# Patient Record
Sex: Female | Born: 1955 | Race: Black or African American | Hispanic: No | Marital: Married | Smoking: Never smoker
Health system: Southern US, Community
[De-identification: ages and names within clinical notes are randomized; demographics above are authoritative.]

## PROBLEM LIST (undated history)

## (undated) DIAGNOSIS — T4145XA Adverse effect of unspecified anesthetic, initial encounter: Secondary | ICD-10-CM

## (undated) DIAGNOSIS — G43909 Migraine, unspecified, not intractable, without status migrainosus: Secondary | ICD-10-CM

## (undated) DIAGNOSIS — Z87411 Personal history of vaginal dysplasia: Secondary | ICD-10-CM

## (undated) DIAGNOSIS — Z86018 Personal history of other benign neoplasm: Secondary | ICD-10-CM

## (undated) DIAGNOSIS — K219 Gastro-esophageal reflux disease without esophagitis: Secondary | ICD-10-CM

## (undated) DIAGNOSIS — K5909 Other constipation: Secondary | ICD-10-CM

## (undated) DIAGNOSIS — Z973 Presence of spectacles and contact lenses: Secondary | ICD-10-CM

## (undated) DIAGNOSIS — T8859XA Other complications of anesthesia, initial encounter: Secondary | ICD-10-CM

## (undated) DIAGNOSIS — I10 Essential (primary) hypertension: Secondary | ICD-10-CM

## (undated) DIAGNOSIS — N891 Moderate vaginal dysplasia: Secondary | ICD-10-CM

## (undated) HISTORY — PX: HYMENECTOMY: SHX987

## (undated) HISTORY — DX: Essential (primary) hypertension: I10

---

## 1994-08-26 HISTORY — PX: UMBILICAL HERNIA REPAIR: SHX196

## 1997-08-26 HISTORY — PX: HYSTEROSCOPY WITH RESECTOSCOPE: SHX5395

## 1997-12-16 ENCOUNTER — Ambulatory Visit (HOSPITAL_COMMUNITY): Admission: RE | Admit: 1997-12-16 | Discharge: 1997-12-16 | Payer: Self-pay | Admitting: Obstetrics and Gynecology

## 1998-08-10 ENCOUNTER — Inpatient Hospital Stay (HOSPITAL_COMMUNITY): Admission: RE | Admit: 1998-08-10 | Discharge: 1998-08-12 | Payer: Self-pay | Admitting: Obstetrics and Gynecology

## 1999-01-11 ENCOUNTER — Other Ambulatory Visit: Admission: RE | Admit: 1999-01-11 | Discharge: 1999-01-11 | Payer: Self-pay | Admitting: Obstetrics and Gynecology

## 1999-04-02 ENCOUNTER — Encounter: Payer: Self-pay | Admitting: Emergency Medicine

## 1999-04-02 ENCOUNTER — Emergency Department (HOSPITAL_COMMUNITY): Admission: EM | Admit: 1999-04-02 | Discharge: 1999-04-02 | Payer: Self-pay | Admitting: Emergency Medicine

## 2002-04-29 ENCOUNTER — Encounter: Admission: RE | Admit: 2002-04-29 | Discharge: 2002-04-29 | Payer: Self-pay | Admitting: Obstetrics and Gynecology

## 2002-04-29 ENCOUNTER — Encounter: Payer: Self-pay | Admitting: Obstetrics and Gynecology

## 2003-08-27 HISTORY — PX: COMBINED HYSTEROSCOPY DIAGNOSTIC / D&C: SUR297

## 2004-05-24 ENCOUNTER — Inpatient Hospital Stay (HOSPITAL_COMMUNITY): Admission: EM | Admit: 2004-05-24 | Discharge: 2004-05-25 | Payer: Self-pay | Admitting: *Deleted

## 2004-05-25 ENCOUNTER — Encounter (INDEPENDENT_AMBULATORY_CARE_PROVIDER_SITE_OTHER): Payer: Self-pay | Admitting: *Deleted

## 2004-05-25 HISTORY — PX: TRANSTHORACIC ECHOCARDIOGRAM: SHX275

## 2004-06-19 ENCOUNTER — Encounter: Admission: RE | Admit: 2004-06-19 | Discharge: 2004-06-19 | Payer: Self-pay | Admitting: Obstetrics and Gynecology

## 2004-07-23 ENCOUNTER — Other Ambulatory Visit: Admission: RE | Admit: 2004-07-23 | Discharge: 2004-07-23 | Payer: Self-pay | Admitting: Obstetrics and Gynecology

## 2005-03-22 IMAGING — US US PELVIS COMPLETE MODIFY
1 series · 14 of 25 positions shown · non-contrast
Comparison: none

CLINICAL DATA: Anemia. History fibroids and myomectomy.

[Series 1: unknown · 0.32mm/px · 14 of 67 slices shown]
[im 1/67]
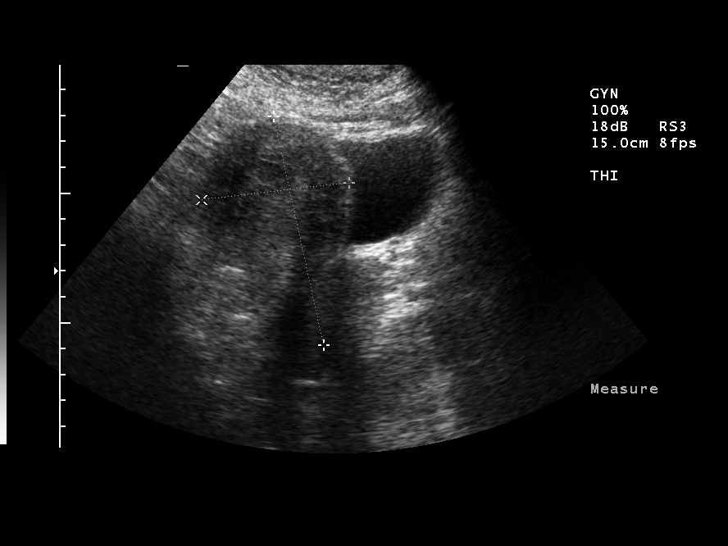
[im 6/67]
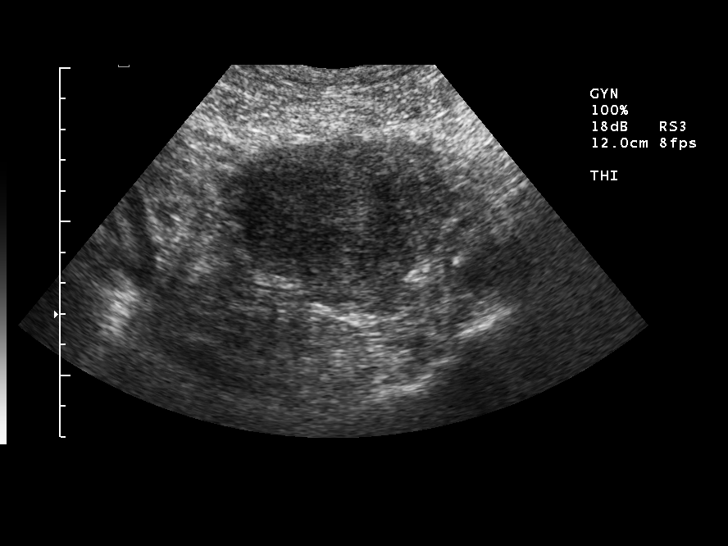
[im 12/67]
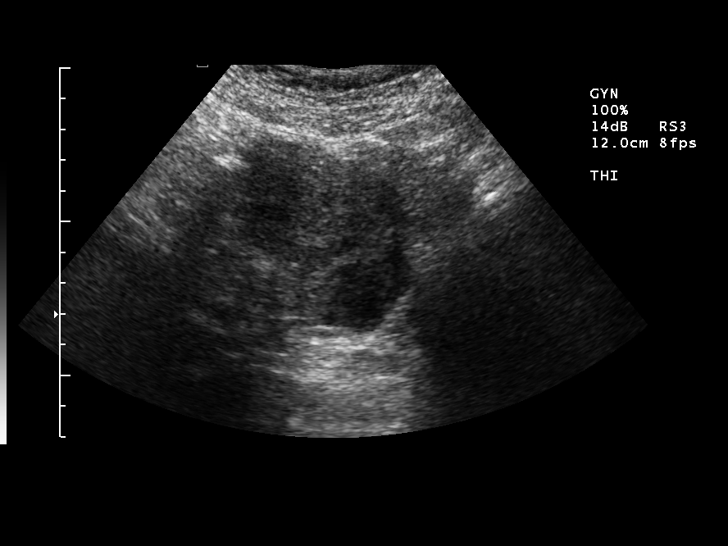
[im 17/67]
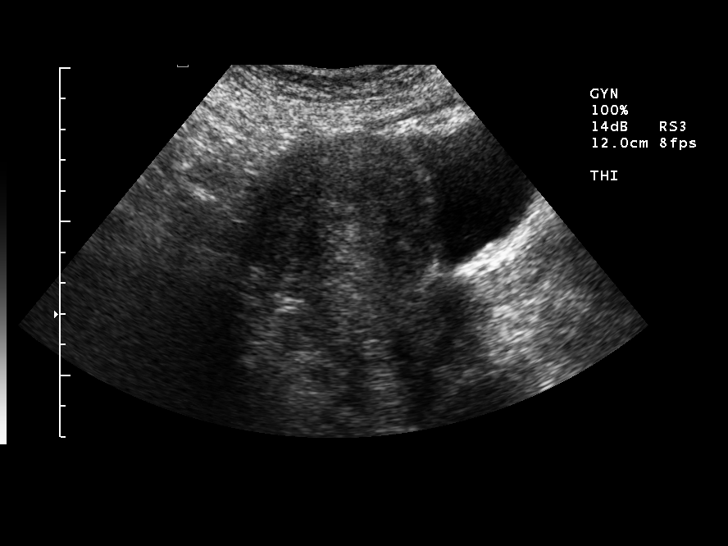
[im 23/67]
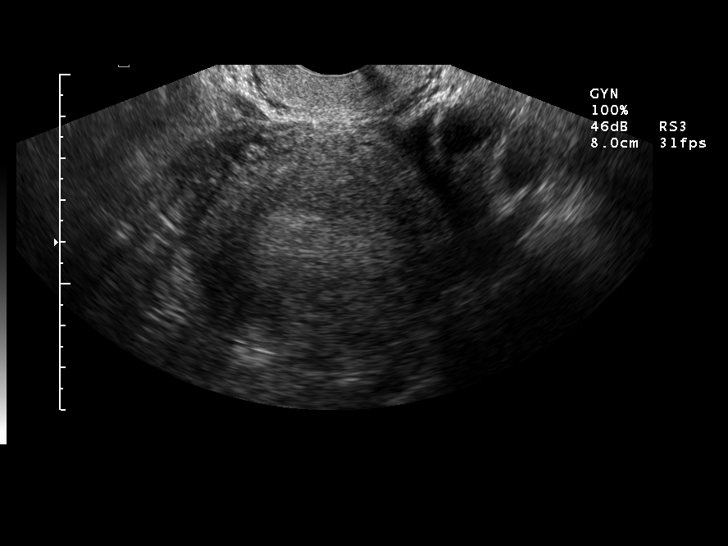
[im 25/67]
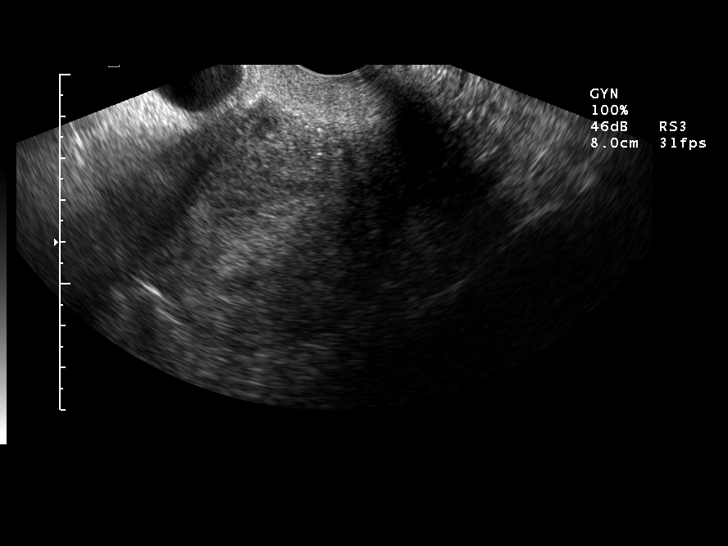
[im 31/67]
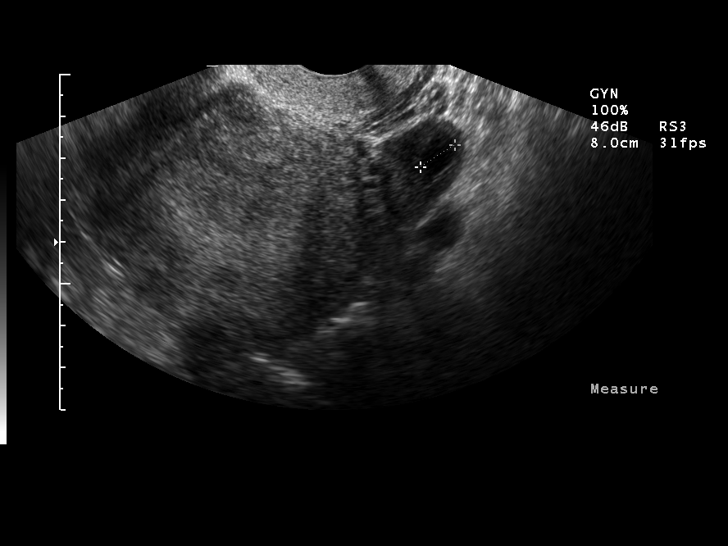
[im 36/67]
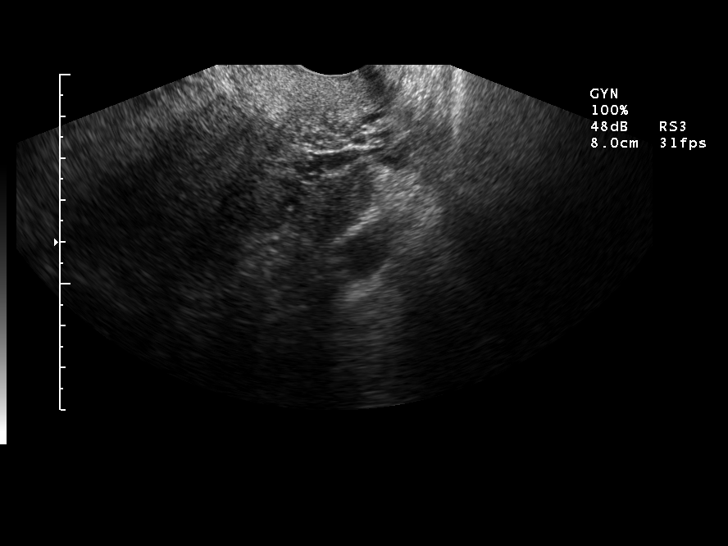
[im 42/67]
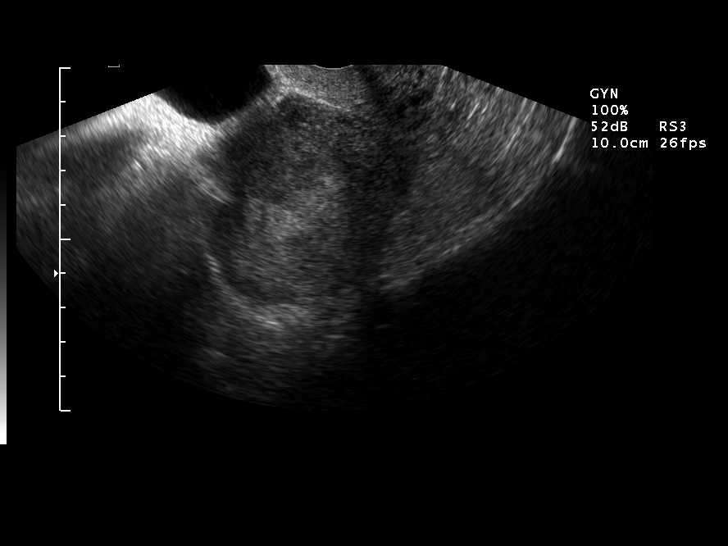
[im 45/67]
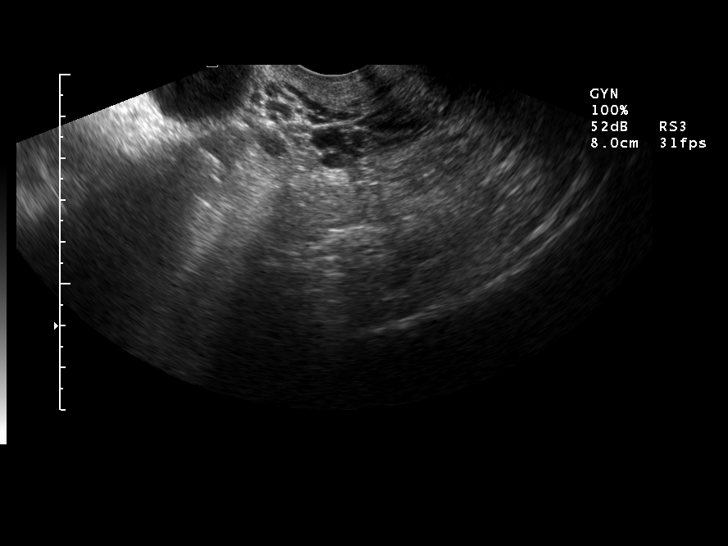
[im 50/67]
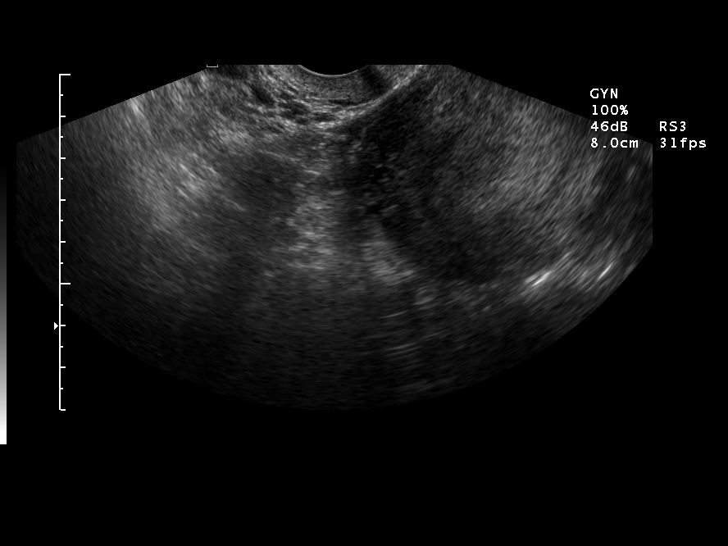
[im 56/67]
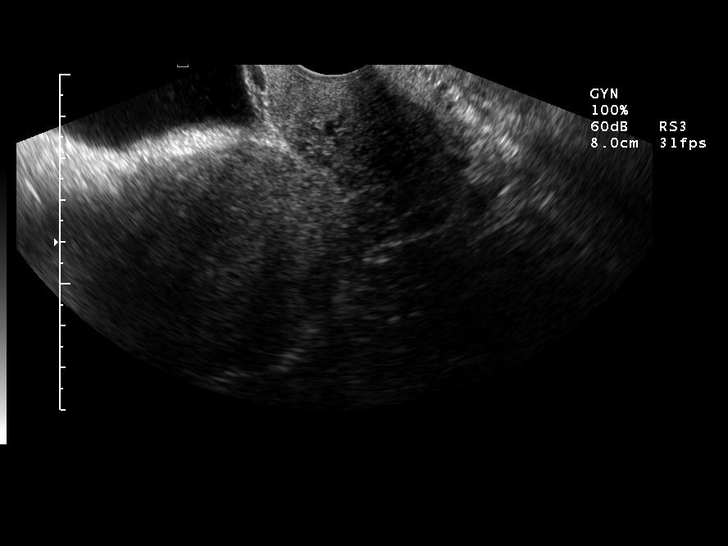
[im 61/67]
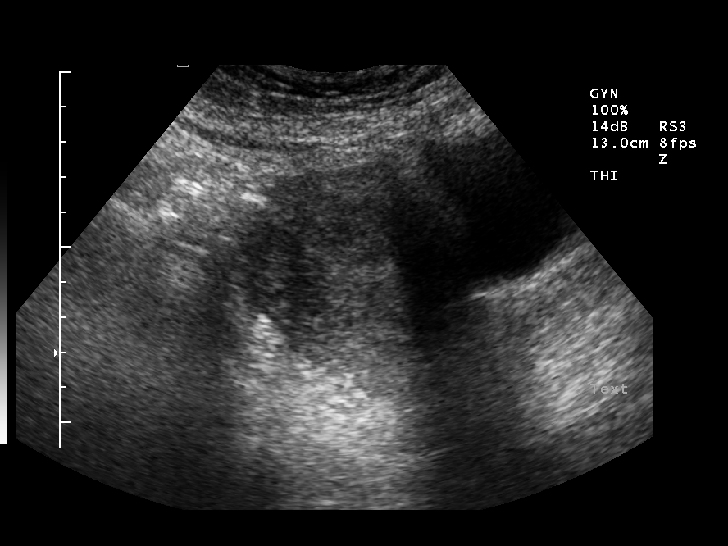
[im 67/67]
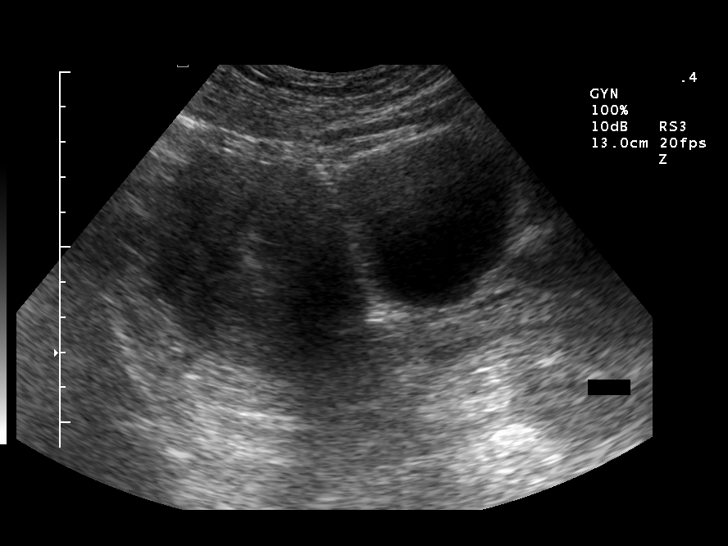

[14 of 25 positions shown; findings below may reference images not displayed]

Ultrasound pelvis

Transabdominal and transvaginal scanning performed.

The uterus measures 9.0 x 5.5 x 6.2 cm. The myometrium is heterogeneous and there are 2 ill-defined
fibroids identified measuring 12 x 9 mm and 23 x 26 mm. The endometrium is heterogeneous and is
considerably thickened at 2. cm. This may be due to endometrial neoplasm or blood in the uterine
cavity.

The right ovary measures 21 x 14 x 18 mm. It appears normal. The left ovary measures 36 x 24 x 21
mm and contains a 15 x 10 mm cyst. There is no free fluid.
IMPRESSION: Heterogeneous uterus containing several fibroids.

The endometrium is thickened at 2 cm. This may be due to an endometrial neoplasm or blood in the
uterine cavity. Consider hysterosonogram to evaluate for mass lesion.

## 2005-03-22 IMAGING — CR DG CHEST 2V
2 series · 2 of 2 positions shown · non-contrast
Comparison: none

CLINICAL DATA: Anemia. Preadmission.
CHEST 2 VIEWS
 PA and lateral views without comparison views reveal the heart size to be normal. Diffuse
calcified granulomas are present. No active lung findings.
IMPRESSION
No active disease.

[view not recorded (1 of 2)]
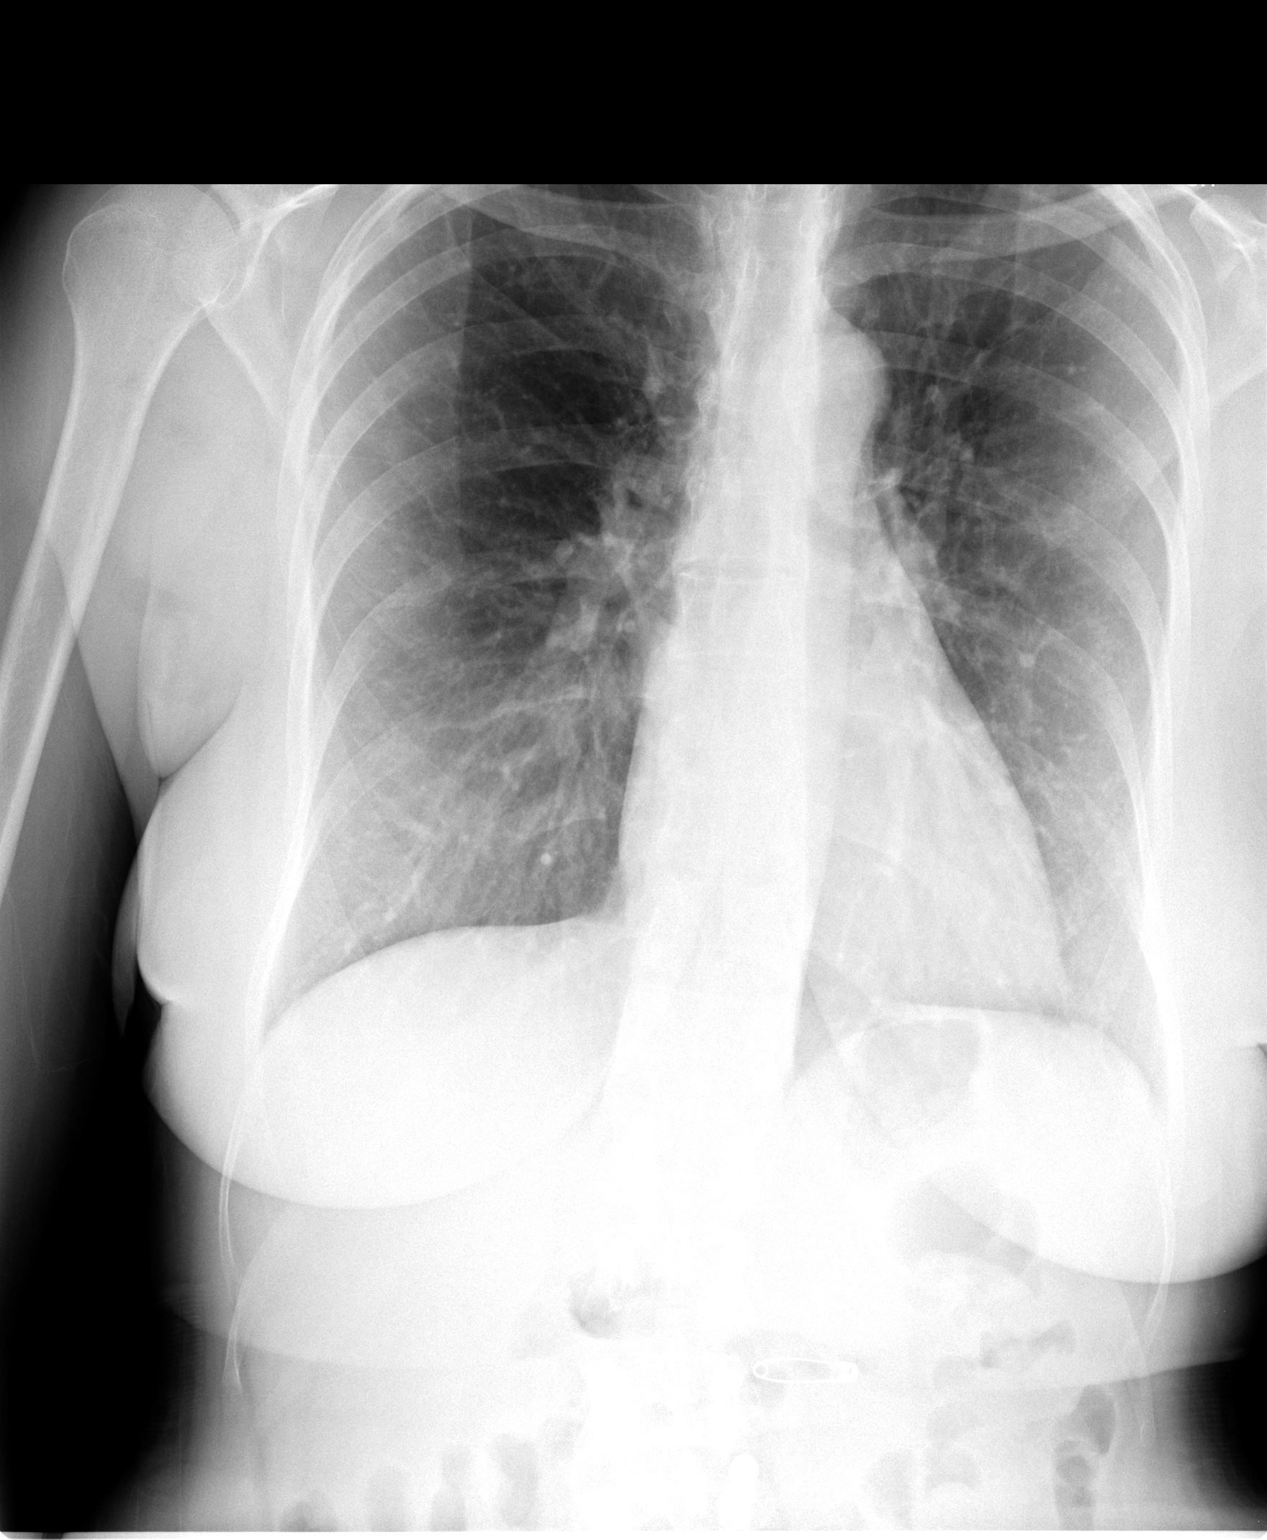

[view not recorded (2 of 2)]
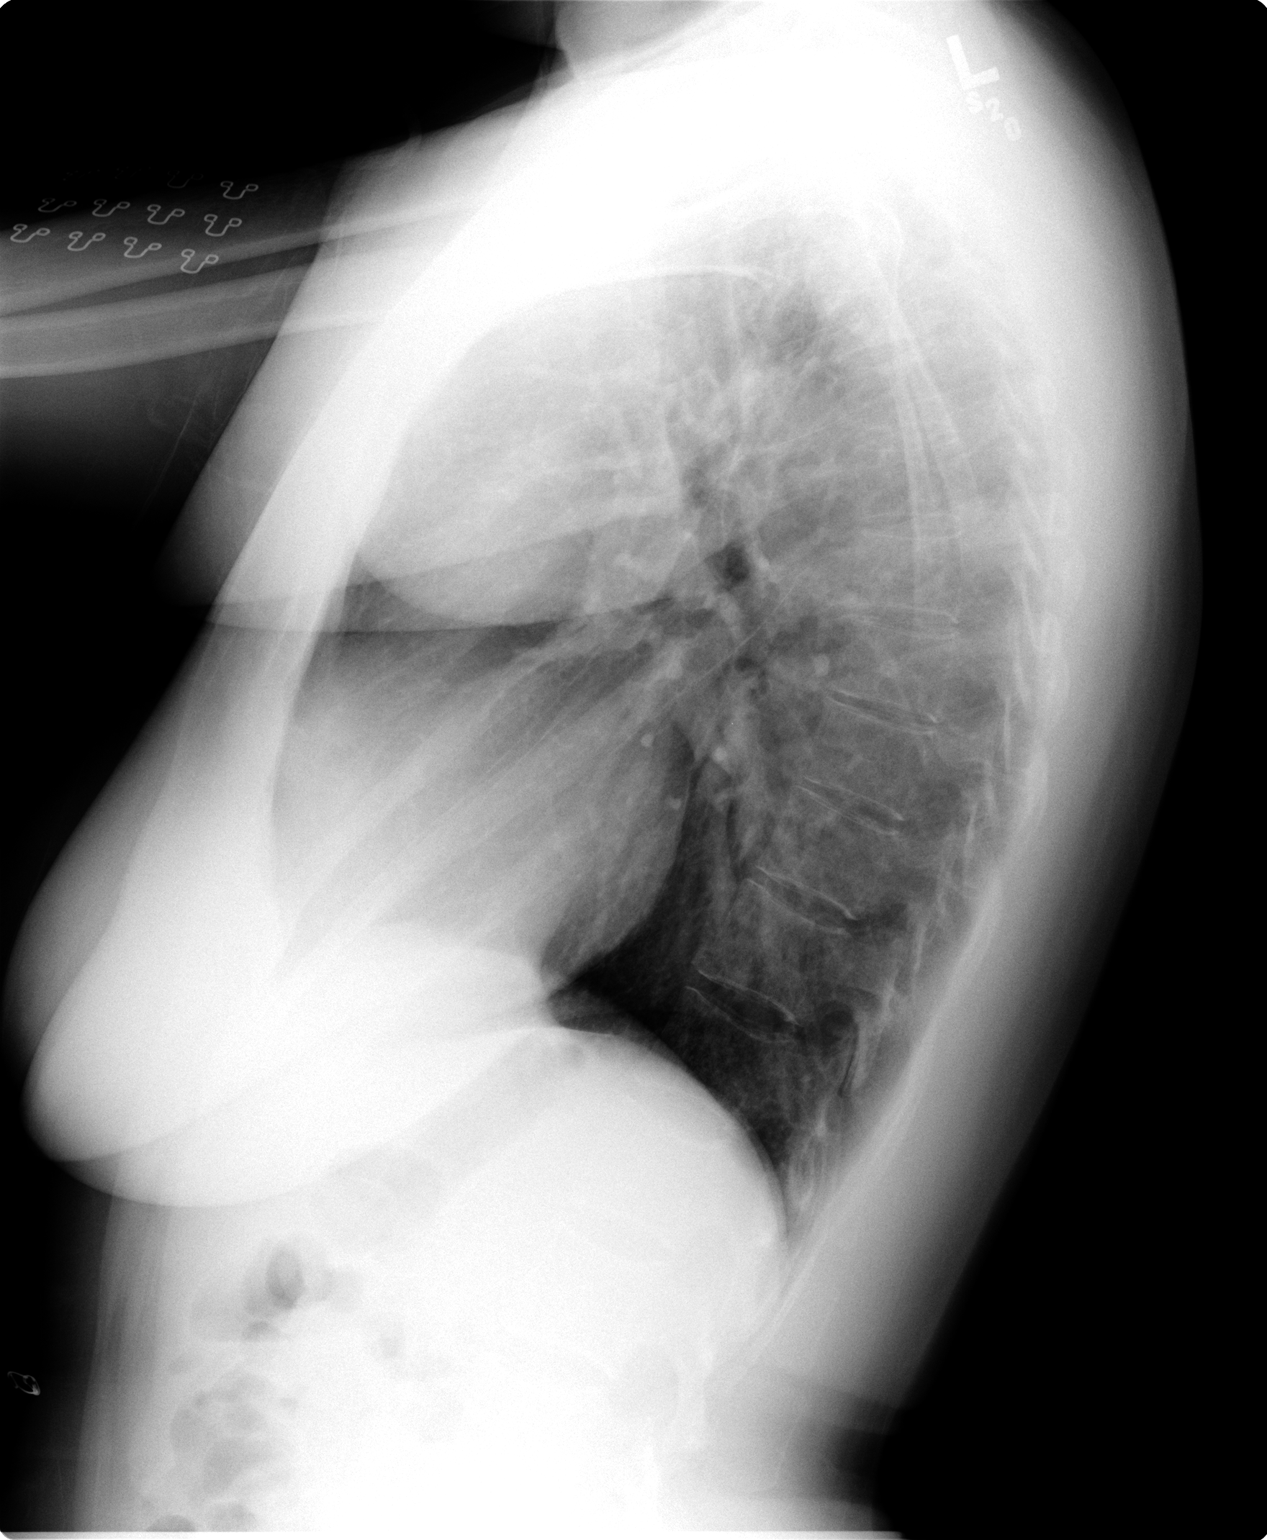

[2 of 2 positions shown; findings below may reference images not displayed]

## 2005-04-01 ENCOUNTER — Other Ambulatory Visit: Admission: RE | Admit: 2005-04-01 | Discharge: 2005-04-01 | Payer: Self-pay | Admitting: Obstetrics and Gynecology

## 2005-06-25 ENCOUNTER — Encounter (INDEPENDENT_AMBULATORY_CARE_PROVIDER_SITE_OTHER): Payer: Self-pay | Admitting: *Deleted

## 2005-06-25 ENCOUNTER — Observation Stay (HOSPITAL_COMMUNITY): Admission: RE | Admit: 2005-06-25 | Discharge: 2005-06-26 | Payer: Self-pay | Admitting: Obstetrics and Gynecology

## 2005-06-25 HISTORY — PX: LAPAROSCOPIC ASSISTED VAGINAL HYSTERECTOMY: SHX5398

## 2007-06-11 ENCOUNTER — Encounter: Admission: RE | Admit: 2007-06-11 | Discharge: 2007-06-11 | Payer: Self-pay | Admitting: Obstetrics and Gynecology

## 2008-11-29 ENCOUNTER — Encounter: Admission: RE | Admit: 2008-11-29 | Discharge: 2008-11-29 | Payer: Self-pay | Admitting: Obstetrics and Gynecology

## 2011-01-11 NOTE — H&P (Signed)
[name], Castro              ACCOUNT NO.:  [id number]   MEDICAL RECORD NO.:  [id number]          PATIENT TYPE:  INP   LOCATION:  0102                         FACILITY:  [facility]   PHYSICIAN:  Deirdre Peer. Polite, M.D. DATE OF BIRTH:  04/12/1956   DATE OF ADMISSION:  05/24/2004  DATE OF DISCHARGE:                                HISTORY & PHYSICAL   CHIEF COMPLAINT:  No energy, feet swelling.   HISTORY OF PRESENT ILLNESS:  [name] is a pleasant 55 year old female  with no significant past medical history except for iron-deficiency anemia  secondary to menorrhagia. The patient presented to her primary care M.D. for  evaluation secondary to lethargy and bilateral feet swelling for 2 to 3  weeks. The patient thought that she was just retaining fluid. The patient  had blood work which revealed critical anemia of 5.4 and per the patient  possibly a UTI. The patient was asked to come back to the office for  inpatient admission for further evaluation and treatment. At the time of my  evaluation, the patient is alert and oriented in no apparent distress. Does  admit to having significant history of menometrorrhagia all of her life; in  fact has been noncompliant with iron x3 months secondary to constipation.  The patient admits to heavy menstrual flow, 5 to 6 days every 30 day cycle.  During that time, the patient requires changing her pads 3 to 4 times a day,  and when she does that, she wears 2 pads each time she changes it. The  patient denies any blood per rectum. Does admit some vague discomfort in  chest at night. Denies any orthopnea. Does admit to swelling in the feet a  couple of weeks. Admission is deemed necessary for further evaluation and  treatment.   PAST MEDICAL HISTORY:  As stated above.   MEDICATIONS ON ADMISSION:  None.   SOCIAL HISTORY:  Negative for tobacco, alcohol, or drugs.   PAST SURGICAL HISTORY:  Significant for fibroid tumors removed from the  uterus  approximately 3 years ago. The patient had a Cesarean section  approximately 20 years ago.   ALLERGIES:  The patient describes allergy to PENICILLIN which causes hives.   FAMILY HISTORY:  Mother with hypertension, father unknown. Five brothers and  two sisters who are relatively healthy.   REVIEW OF SYSTEMS:  As stated above in the HPI.   PHYSICAL EXAMINATION:  GENERAL:  The patient was alert and oriented x3 in no  apparent distress.  VITAL SIGNS:  Temperature 98.2, BP 114/70, pulse 79, respiratory rate 20,  saturating 100%.  HEENT:  Significant for pale sclerae.  CHEST:  Clear to auscultation. No rales, no rhonchi.  CARDIOVASCULAR:  S1 and S2. No S3 appreciated.  ABDOMEN:  Soft, nontender. No organomegaly.  EXTREMITIES:  No edema, 2+ pulse, pale nail beds.  NEUROLOGICAL: Nonfocal.   LABORATORY DATA:  CBC:  White count 5.1, hemoglobin 5, hematocrit 17.8, MCV  52.7, platelets 324, reticulocyte count 2.5. Outpatient iron studies shows a  ferritin level of 2.2, iron of 11, iron binding capacity of 514, iron  saturation of 2.   ASSESSMENT:  1.  Critical anemia of 5.0.  2.  History of iron-deficiency anemia, was compliant with iron x3 years.  3.  History of menometrorrhagia.  4.  Bilateral feet swelling for 3 to 4 weeks which has resolved with      elevation of legs.  5.  Rule out high output failure to critical anemia.   RECOMMENDATIONS:  Recommend patient be admitted to medicine floor bed,  transfused with 3 units of packed red blood cells, iron studies which have  been checked. Also recommend pelvic ultrasound to rule out dominant fibroid  significant menometrorrhagia. The patient will most likely require an  outpatient GYN evaluation for definitive treatment of menometrorrhagia. As  the patient has complained of swelling and legs and decreased energy, will  entertain the idea of obtaining a 2-D echocardiogram to rule out high output  failure secondary to anemia. If the  patient's chest x-ray shows  cardiomegaly, will proceed with ordering 2-D echocardiogram. Will make  further recommendations after review of the above studies.     Madeline Castro   RDP/MEDQ  D:  05/24/2004  T:  05/24/2004  Job:  [id number]   cc:   Madeline Castro, M.D.  [address]  [place], [place] [address]  Fax: [phone]

## 2011-01-11 NOTE — Discharge Summary (Signed)
NAMECARLYANN, Madeline Castro              ACCOUNT NO.:  [id number]   MEDICAL RECORD NO.:  [id number]          PATIENT TYPE:  INP   LOCATION:  0349                         FACILITY:  [facility]   PHYSICIAN:  Melissa L. Ladona Ridgel, MD  DATE OF BIRTH:  1956-06-26   DATE OF ADMISSION:  05/24/2004  DATE OF DISCHARGE:  05/25/2004                                 DISCHARGE SUMMARY   DISCHARGE DIAGNOSES:  1.  Critical anemia secondary to menorrhagia.  The patient had responded to      transfusion of packed red blood cells totalling 4 units.  She will      follow up with her outpatient physician and restart her iron therapy at      home.  2.  Abnormal thickening of the endometrium on pelvic ultrasound. This will      be followed up by Dr. Henderson Cloud in the outpatient setting, using      hysterosonography.  3.  Lower extremity edema.  This episode resolved with elevation of the      legs. A 2D echocardiogram was completed which preliminarily shows an      ejection fraction of 50%, formal report will follow, however does appear      to be an adequate study.   HISTORY OF PRESENT ILLNESS:  The patient is a 55 year old African-American  female with past medical history significant for menometrorrhagia.  The  patient's last menstrual period just ended May 11, 2004.  She  presented to the emergency room with complaints of lethargy and bilateral  swelling of feet x2-3 weeks.  The patient was found to have critical anemia  of 5.4 and she therefore was admitted to telemetry for transfusion of packed  red blood cells.  The patient received a total of 3 units of packed red  cells with an elevation of her blood count to a hemoglobin of 8.8.  We  obtained a pelvic ultrasound which did show some abnormal thickening of the  endometrium suggestive of either retained blood versus a mass.  I discussed  this case with Dr. Rana Snare who is covering for Dr. Henderson Cloud today.  He states  that this patient can be followed as an  outpatient.  He requests the patient  be sent for follow up hysterosonogram next week.  The plan is to provide  this patient with one more unit of packed cells and discharge her to home on  iron.   On the day of discharge her vital signs are stable, temperature is 98.7,  blood pressure 113/61, pulse is 72, respiratory rate is 18.  GENERAL:  She is in no acute distress.  HEENT:  Pupils are equal, round and reactive to light. Extraocular muscles  are intact.  Mucous membranes are moist. She is anicteric.  NECK:  Supple, no JVD, no lymph nodes.  CHEST:  Clear to auscultation, no rhonchi, rales or wheezes.  CARDIOVASCULAR:  Regular rate and rhythm, positive S1, S2, no S3, S4, no  murmurs, rubs, or gallops.  ABDOMEN:  Soft, nontender, nondistended with positive bowel sounds.  EXTREMITIES:  Show no clubbing, cyanosis or edema.  NEUROLOGIC:  The patient is nonfocal as stated.   LABORATORY DATA:  On the day of discharge white count is 7.4 thousand,  hemoglobin of 8.8, a hematocrit of 27.9 (this is after 3 units of packed  cells), platelets are 332,000.  TSH is 2.119.   Preliminary review of the 2D echo with [facility] reveals and ejection  fraction of 50% with no other abnormalities.  A formal report will be  provided at a later time.   At that time the patient is deemed stable for follow up to her primary care  physician and her gynecologist.  She will be requested to resume her iron  therapy and make an appointment to see next week for follow up ultrasound.   CONDITION ON DISCHARGE:  Stable.     Meli   MLT/MEDQ  D:  05/25/2004  T:  05/26/2004  Job:  [id number]   cc:   Guy Sandifer. Arleta Creek, M.D.  [address]  [place]  [place] [address]  Fax: [phone]   Holley Bouche, M.D.  [address]  [place], [place] [address]  Fax: [phone]

## 2011-01-11 NOTE — H&P (Signed)
Madeline Castro, Madeline Castro              ACCOUNT NO.:  [id number]   MEDICAL RECORD NO.:  [id number]          PATIENT TYPE:  AMB   LOCATION:  SDC                           FACILITY:  [facility]   PHYSICIAN:  Guy Sandifer. Henderson Cloud, M.D. DATE OF BIRTH:  05-13-1956   DATE OF ADMISSION:  DATE OF DISCHARGE:                                HISTORY & PHYSICAL   DATE OF ADMISSION:  June 25, 2005   CHIEF COMPLAINT:  Heavy menses.   HISTORY OF PRESENT ILLNESS:  The patient is a 55 year old divorced black  female G5 P3 who is currently not sexually active with increasingly-heavy  menses taking her out of work. Ultrasound on June 06, 2004 revealed the  uterus measuring 9.4 x 5.1 x 6.4 cm. At that time, endocavitary polypoid  masses were noted. The patient was subsequently taken to the operating room  for hysteroscopy D&C with benign pathology. She has continued to have  extremely heavy bleeding. 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 Pap smear in August 2006 is atypical with negative high-risk HPV.  Colposcopy is done and biopsies are benign. After discussion of the options,  the patient is being admitted for a laparoscopic-assisted vaginal  hysterectomy and removal of a tube and ovary if abnormal.   PAST MEDICAL HISTORY:  Negative.   PAST SURGICAL HISTORY:  1.  Hymenectomy for an imperforate hymen at age 38.  2.  Repair of umbilical hernia in 1996.  3.  Uterine myomectomy, 1999.  4.  Hysteroscopy D&C, 2005.   MEDICATIONS:  Vitamins.   ALLERGIES:  PENICILLIN leading to rash.   OBSTETRICAL HISTORY:  Vaginal delivery x2, cesarean section x1.   SOCIAL HISTORY:  The patient denies tobacco, alcohol, or drug abuse.   FAMILY HISTORY:  Positive for diabetes in maternal grandmother, malignancy  of unknown type in maternal grandmother.   REVIEW OF SYSTEMS:  NEURO:  Denies headache. CARDIO:  Denies chest pain.  PULMONARY:  Denies shortness of breath.   PHYSICAL EXAMINATION:  VITAL SIGNS:   Height 5 feet 5 inches, blood pressure  124/84.  HEENT:  Without thyromegaly.  LUNGS:  Clear to auscultation.  HEART:  Regular rate and rhythm.  BACK:  Without CVA tenderness.  BREASTS:  Not examined.  ABDOMEN:  Soft, nontender, without masses.  PELVIC:  Vulva and vagina without lesions. Cervix with leukoplakia as above.  Uterus is 8 weeks size with a probably fundal fibroid. Adnexa nontender  without palpable masses.  EXTREMITIES:  Grossly within normal limits.  NEUROLOGIC:  Grossly within normal limits.   ASSESSMENT:  Menometrorrhagia.   PLAN:  Laparoscopic-assisted vaginal hysterectomy and removal of a tube and  ovary if abnormal.      Guy Sandifer. Henderson Cloud, M.D.  Electronically Signed     JET/MEDQ  D:  06/21/2005  T:  06/21/2005  Job:  [id number]

## 2011-01-11 NOTE — Discharge Summary (Signed)
NAMECHARLY, Madeline Castro              ACCOUNT NO.:  [id number]   MEDICAL RECORD NO.:  [id number]          PATIENT TYPE:  OBV   LOCATION:  9307                          FACILITY:  [facility]   PHYSICIAN:  Guy Sandifer. Henderson Cloud, M.D. DATE OF BIRTH:  [birth year]   DATE OF ADMISSION:  06/25/2005  DATE OF DISCHARGE:                                 DISCHARGE SUMMARY   ADMITTING DIAGNOSIS:  Menometrorrhagia.   DISCHARGE DIAGNOSIS:  Menometrorrhagia.   PROCEDURE:  On June 25, 2005, is laparoscopically-assisted vaginal  hysterectomy.   REASON FOR ADMISSION:  This patient is a 55 year old divorced black female  G5 P3 with increasingly heavy bleeding. Details dictated in the history and  physical. She is admitted for surgical management.   HOSPITAL COURSE:  The patient is admitted to the hospital and undergoes the  above procedure. On the evening of surgery she has good pain relief. She has  had some nausea relieved with medications and is not yet passing flatus.  Urine output is clear, vital signs are stable, and she is afebrile. On the  day of discharge she is passing flatus and has tolerated a regular diet.  Foley catheter has been removed and she has voided. Pain relief is good.  Vital signs are stable and she remains afebrile. White count is 14.4,  hemoglobin 10.3, and pathology is pending. Her abdomen is soft and flat with  positive bowel sounds.   CONDITION ON DISCHARGE:  Good.   DIET:  Regular as tolerated.   ACTIVITY:  No lifting, no operation of automobiles, no vaginal entry. She is  to call the office for problems including but not limited to heavy vaginal  bleeding, temperature of 101 degrees, persistent nausea or vomiting, or  increasing pain.   MEDICATIONS:  1.  Percocet 5/325 mg #30 one to two p.o. q.6h. p.r.n.  2.  Ibuprofen 600 mg q.6h. p.r.n.  3.  Multivitamin daily.   FOLLOW-UP:  Is in the office in two weeks.      Guy Sandifer Henderson Cloud, M.D.  Electronically Signed     JET/MEDQ  D:  06/26/2005  T:  06/26/2005  Job:  [id number]

## 2011-01-11 NOTE — Op Note (Signed)
NAMESAMAYAH, NOVINGER              ACCOUNT NO.:  [id number]   MEDICAL RECORD NO.:  [id number]          PATIENT TYPE:  OBV   LOCATION:  9307                          FACILITY:  [facility]   PHYSICIAN:  Guy Sandifer. Henderson Cloud, M.D. DATE OF BIRTH:  November 24, 1955   DATE OF PROCEDURE:  06/25/2005  DATE OF DISCHARGE:                                 OPERATIVE REPORT   PREOPERATIVE DIAGNOSIS:  Menometrorrhagia.   POSTOPERATIVE DIAGNOSIS:  Menometrorrhagia.   PROCEDURE:  Laparoscopically-assisted vaginal hysterectomy.   SURGEON:  Guy Sandifer. Henderson Cloud, M.D.   ASSISTANT:  Zelphia Cairo, M.D.   ANESTHESIA:  General endotracheal intubation.   ESTIMATED BLOOD LOSS:  400 mL.   SPECIMENS TO PATHOLOGY:  Uterus.   INDICATIONS:  The patient is a 55 year old divorced black female, gravida 5,  para 3 with increasingly heavy menses. Details dictated in history and  physical. Laparoscopically-assisted vaginal hysterectomy and removal of tube  or ovaries if distinctly abnormal has been discussed preoperatively.  Potential risks and complications were reviewed preoperatively including but  not limited to infection, bowel, bladder, ureteral damage, bleeding  requiring transfusion of blood products with possible transfusion reaction,  HIV and hepatitis acquisition, DVT, PE, pneumonia, fistula formation,  dyspareunia, laparotomy. All questions were answered, consent signed on the  chart.   FINDINGS:  Uterus has an approximately 3 cm fundal subserosal fibroid. Tubes  and ovaries normal bilaterally. Anterior posterior cul-de-sacs were normal.   PROCEDURE:  The patient is taken to operating room where she is identified,  placed in dorsosupine position and general anesthesia was obtained via  endotracheal intubation. She is then placed in dorsal lithotomy position  where she is prepped abdominally and vaginally, bladder straight  catheterized, Hulka tenaculum was placed uterus was manipulator, and she is  draped in  sterile fashion. Two Allis clamps used to create an incision  beneath the umbilicus and dissection is carried out in layers. The anterior  fascia is entered and each angle of the incision was anchored with a 0  Vicryl suture. Further dissection enters the peritoneal cavity without  difficulty. Disposable open laparoscopic trocar sleeve was placed and tied  down with anchoring sutures. Inspection reveals the above findings. A small  suprapubic incision was made and a 5 mm disposable XL bladeless trocar  sleeve was placed under direct visualization without difficulty. The above  findings were noted. The gyrus bipolar cautery cutting instrument was then  used to take down the proximal ligaments bilaterally to the level  vesicouterine peritoneum. The vesicouterine peritoneum was incised in  midline and dissected cephalolaterally. The suprapubic trocar sleeve was  removed and attention was turned to vagina. Cervix is largely flush with the  vaginal fornix. Dissection releases the cervix and the posterior cul-de-sac  is entered sharply. Cervix was circumscribed with a scalpel and the anterior  cul-de-sac was also entered without difficulty. Then using the gyrus bipolar  cautery instrument. The uterosacral ligaments  were taken down followed by  the bladder pillars and the cardinal ligaments and the uterine vessels  bilaterally. Fundus was delivered posteriorly. Remaining pedicles were taken  down. The specimens delivered.  The sutures 0 Monocryl placed at 3 and 9  o'clock position to ensure complete hemostasis. All suture will be 0  Monocryl unless otherwise designated. Uterosacral ligaments were plicated  the vaginal cuff bilaterally. Vaginal cuff is then closed with figure-of-  eights. Foley catheter was placed in the bladder and clear urine was noted.  Returning to the laparoscope, careful inspection reveals only minor bleeding  at the anterior peritoneal edge which was controlled with bipolar  cautery.  Reinspection under reduced pneumoperitoneum reveals excellent hemostasis all  around. Excess fluid was removed. All trocar sleeves were removed and the  pneumoperitoneum was therefore taken down. The angle sutures of the  peritoneum at the umbilical incision are tied midline closing the umbilical  incision. The skin of the umbilical incision was closed with interrupted 2-0  Vicryl suture. Both incisions injected with 1/2% plain Marcaine and  Dermabond was applied to both incisions. All counts correct. The patient is  awakened, taken to recovery room in stable condition.      Guy Sandifer Henderson Cloud, M.D.  Electronically Signed     JET/MEDQ  D:  06/25/2005  T:  06/25/2005  Job:  [id number]

## 2013-04-23 ENCOUNTER — Other Ambulatory Visit: Payer: Self-pay

## 2013-04-23 DIAGNOSIS — Z1231 Encounter for screening mammogram for malignant neoplasm of breast: Secondary | ICD-10-CM

## 2013-04-28 ENCOUNTER — Ambulatory Visit: Payer: Self-pay

## 2013-04-29 ENCOUNTER — Ambulatory Visit
Admission: RE | Admit: 2013-04-29 | Discharge: 2013-04-29 | Disposition: A | Discharge status: Home / Self Care | Payer: Self-pay | Source: Ambulatory Visit

## 2013-04-29 DIAGNOSIS — Z1231 Encounter for screening mammogram for malignant neoplasm of breast: Secondary | ICD-10-CM

## 2013-05-03 ENCOUNTER — Other Ambulatory Visit: Payer: Self-pay | Admitting: Family Medicine

## 2013-05-03 DIAGNOSIS — R928 Other abnormal and inconclusive findings on diagnostic imaging of breast: Secondary | ICD-10-CM

## 2013-05-06 ENCOUNTER — Ambulatory Visit
Admission: RE | Admit: 2013-05-06 | Discharge: 2013-05-06 | Disposition: A | Discharge status: Home / Self Care | Payer: Commercial Indemnity | Source: Ambulatory Visit | Attending: Family Medicine | Admitting: Family Medicine

## 2013-05-06 DIAGNOSIS — R928 Other abnormal and inconclusive findings on diagnostic imaging of breast: Secondary | ICD-10-CM

## 2016-02-04 ENCOUNTER — Encounter (HOSPITAL_COMMUNITY): Payer: Self-pay | Admitting: Emergency Medicine

## 2016-02-04 ENCOUNTER — Emergency Department (HOSPITAL_COMMUNITY)
Admission: EM | Admit: 2016-02-04 | Discharge: 2016-02-04 | Disposition: A | Discharge status: Home / Self Care | Payer: BLUE CROSS/BLUE SHIELD | Attending: Emergency Medicine | Admitting: Emergency Medicine

## 2016-02-04 DIAGNOSIS — W57XXXA Bitten or stung by nonvenomous insect and other nonvenomous arthropods, initial encounter: Secondary | ICD-10-CM | POA: Diagnosis not present

## 2016-02-04 DIAGNOSIS — Y999 Unspecified external cause status: Secondary | ICD-10-CM | POA: Diagnosis not present

## 2016-02-04 DIAGNOSIS — Y939 Activity, unspecified: Secondary | ICD-10-CM | POA: Diagnosis not present

## 2016-02-04 DIAGNOSIS — Y929 Unspecified place or not applicable: Secondary | ICD-10-CM | POA: Insufficient documentation

## 2016-02-04 DIAGNOSIS — S40861A Insect bite (nonvenomous) of right upper arm, initial encounter: Secondary | ICD-10-CM | POA: Diagnosis present

## 2016-02-04 DIAGNOSIS — L03113 Cellulitis of right upper limb: Secondary | ICD-10-CM | POA: Insufficient documentation

## 2016-02-04 DIAGNOSIS — Z79899 Other long term (current) drug therapy: Secondary | ICD-10-CM | POA: Insufficient documentation

## 2016-02-04 MED ORDER — HYDROXYZINE HCL 25 MG PO TABS: 25.0000 mg | ORAL_TABLET | ORAL | Status: DC | PRN

## 2016-02-04 MED ORDER — CEPHALEXIN 500 MG PO CAPS: 500.0000 mg | ORAL_CAPSULE | Freq: Four times a day (QID) | ORAL | Status: AC

## 2016-02-04 MED ORDER — SULFAMETHOXAZOLE-TRIMETHOPRIM 800-160 MG PO TABS: 1.0000 | ORAL_TABLET | Freq: Two times a day (BID) | ORAL | Status: DC

## 2016-02-04 MED ORDER — CEPHALEXIN 500 MG PO CAPS
500.0000 mg | ORAL_CAPSULE | Freq: Once | ORAL | Status: AC
  Administered 2016-02-04: 500 mg via ORAL
  Filled 2016-02-04: qty 1

## 2016-02-04 MED ORDER — HYDROXYZINE HCL 25 MG PO TABS
50.0000 mg | ORAL_TABLET | Freq: Once | ORAL | Status: AC
Start: 2016-02-04 — End: 2016-02-04
  Administered 2016-02-04: 50 mg via ORAL
  Filled 2016-02-04: qty 2

## 2016-02-04 MED ORDER — SULFAMETHOXAZOLE-TRIMETHOPRIM 800-160 MG PO TABS
1.0000 | ORAL_TABLET | Freq: Once | ORAL | Status: AC
  Administered 2016-02-04: 1 via ORAL
  Filled 2016-02-04: qty 1

## 2016-02-04 NOTE — ED Notes (Signed)
Pt c/o left arm edema, erythema, itching, and multiple small bulla to right posterior elbow. Skin is red, tissue is swollen. Joint mobile.

## 2016-02-04 NOTE — Discharge Instructions (Signed)
Do not apply rubbing alcohol or hydrogen peroxide to the affected area.   Try to elevate the extremity above the level of the heart is much as possible.   If the redness starts spreading, you develop streaking up the arm, fever, chills, nausea, vomiting or any new or concerning symptoms please return immediately to the emergency department for further evaluation   Cellulitis Cellulitis is an infection of the skin and the tissue under the skin. The infected area is usually red and tender. This happens most often in the arms and lower legs. HOME CARE   Take your antibiotic medicine as told. Finish the medicine even if you start to feel better.  Keep the infected arm or leg raised (elevated).  Put a warm cloth on the area up to 4 times per day.  Only take medicines as told by your doctor.  Keep all doctor visits as told. GET HELP IF:  You see red streaks on the skin coming from the infected area.  Your red area gets bigger or turns a dark color.  Your bone or joint under the infected area is painful after the skin heals.  Your infection comes back in the same area or different area.  You have a puffy (swollen) bump in the infected area.  You have new symptoms.  You have a fever. GET HELP RIGHT AWAY IF:   You feel very sleepy.  You throw up (vomit) or have watery poop (diarrhea).  You feel sick and have muscle aches and pains.   This information is not intended to replace advice given to you by your health care provider. Make sure you discuss any questions you have with your health care provider.   Document Released: 01/29/2008 Document Revised: 05/03/2015 Document Reviewed: 10/28/2011 Elsevier Interactive Patient Education [employer]2016 [employer]

## 2016-02-04 NOTE — ED Provider Notes (Signed)
CSN: [id number]     Arrival date & time 02/04/16  1221 History   First MD Initiated Contact with Patient 02/04/16 1323     Chief Complaint  Patient presents with  . Joint Swelling     (Consider location/radiation/quality/duration/timing/severity/associated sxs/prior Treatment) HPI   Blood pressure 139/81, pulse 77, temperature 98.5 F (36.9 C), temperature source Oral, resp. rate 19, SpO2 97 %.  Madeline Castro is a 60 y.o. female complaining of right (note that this contradicts triage note, which states left) arm erythema, pain and blistering worsening over the course of 24 hours. Patient states that she woke up yesterday and had a bug bite, she was the only when the household affected, she states that she scratched the area when she woke up today there was a significant amount of redness. Patient has been applying alcohol frequently, taking Benadryl for itching and applying topical hydrocortisone ointment home with no relief, she's not diabetic, she doesn't take any chronic steroids or immunosuppressants. She denies fever, chills, nausea, vomiting. States pain is moderate, 5 out of 10, no pain medication taken prior to arrival, pain is exacerbated by movement and palpation. She states her last tetanus shot was within the last 5 years. Patient is right-hand-dominant.  History reviewed. No pertinent past medical history. History reviewed. No pertinent past surgical history. History reviewed. No pertinent family history. Social History  Substance Use Topics  . Smoking status: Never Smoker   . Smokeless tobacco: None  . Alcohol Use: None   OB History    No data available     Review of Systems  10 systems reviewed and found to be negative, except as noted in the HPI.   Allergies  Penicillins  Home Medications   Prior to Admission medications   Not on File   BP 139/81 mmHg  Pulse 77  Temp(Src) 98.5 F (36.9 C) (Oral)  Resp 19  SpO2 97% Physical Exam  Constitutional: She is  oriented to person, place, and time. She appears well-developed and well-nourished. No distress.  HENT:  Head: Normocephalic and atraumatic.  Mouth/Throat: Oropharynx is clear and moist.  Eyes: Conjunctivae and EOM are normal. Pupils are equal, round, and reactive to light.  Neck: Normal range of motion.  Cardiovascular: Normal rate, regular rhythm and intact distal pulses.   Pulmonary/Chest: Effort normal and breath sounds normal.  Abdominal: Soft. There is no tenderness.  Musculoskeletal: Normal range of motion. She exhibits edema and tenderness.  Neurological: She is alert and oriented to person, place, and time.  Skin: She is not diaphoretic.  Induration to right arm over elbow, extending from the mid humerus down to the mid radius ulna, does not affect the wrist or hand. Patient has puncture wound just proximal to the elbow and a small area of blistering as diagrammed. Full range of motion to elbow and distally neurovascularly intact.  Psychiatric: She has a normal mood and affect.  Nursing note and vitals reviewed.         ED Course  Procedures (including critical care time) Labs Review Labs Reviewed - No data to display  Imaging Review No results found. I have personally reviewed and evaluated these images and lab results as part of my medical decision-making.   EKG Interpretation None      MDM   Final diagnoses:  Cellulitis of right upper extremity    Filed Vitals:   02/04/16 1238  BP: 139/81  Pulse: 77  Temp: 98.5 F (36.9 C)  TempSrc: Oral  Resp: 19  SpO2: 97%    Medications  sulfamethoxazole-trimethoprim (BACTRIM DS,SEPTRA DS) 800-160 MG per tablet 1 tablet (not administered)  cephALEXin (KEFLEX) capsule 500 mg (not administered)  hydrOXYzine (ATARAX/VISTARIL) tablet 50 mg (not administered)    Madeline Castro is 60 y.o. female presenting with Cellulitis to right arm no immunocompromise or diabetes, patient has no signs of systemic infection, no  indication that this is traveling to joint she has full range of motion, she is distally neurovascularly intact. Patient will be started on Keflex and Bactrim, hydroxyzine for pruritus. We've had an extensive discussion of return precautions and patient verbalized her understanding.  Evaluation does not show pathology that would require ongoing emergent intervention or inpatient treatment. Pt is hemodynamically stable and mentating appropriately. Discussed findings and plan with patient/guardian, who agrees with care plan. All questions answered. Return precautions discussed and outpatient follow up given.   New Prescriptions   CEPHALEXIN (KEFLEX) 500 MG CAPSULE    Take 1 capsule (500 mg total) by mouth 4 (four) times daily.   HYDROXYZINE (ATARAX/VISTARIL) 25 MG TABLET    Take 1 tablet (25 mg total) by mouth every 4 (four) hours as needed for itching.   SULFAMETHOXAZOLE-TRIMETHOPRIM (BACTRIM DS) 800-160 MG TABLET    Take 1 tablet by mouth 2 (two) times daily.         Wynetta Emery, PA-C 02/04/16 1413  Samuel Jester, DO 02/07/16 2257

## 2016-04-22 ENCOUNTER — Other Ambulatory Visit: Payer: Self-pay | Admitting: Obstetrics and Gynecology

## 2016-04-22 DIAGNOSIS — N6320 Unspecified lump in the left breast, unspecified quadrant: Secondary | ICD-10-CM

## 2016-04-22 DIAGNOSIS — N6310 Unspecified lump in the right breast, unspecified quadrant: Secondary | ICD-10-CM

## 2016-04-23 ENCOUNTER — Encounter: Payer: Self-pay | Admitting: Gastroenterology

## 2016-04-25 ENCOUNTER — Other Ambulatory Visit: Payer: Commercial Indemnity

## 2016-07-02 ENCOUNTER — Ambulatory Visit: Payer: Commercial Indemnity | Admitting: Gastroenterology

## 2016-07-15 ENCOUNTER — Ambulatory Visit: Payer: Commercial Indemnity | Admitting: Gastroenterology

## 2016-07-15 ENCOUNTER — Ambulatory Visit: Payer: Commercial Indemnity | Admitting: Internal Medicine

## 2016-07-23 ENCOUNTER — Encounter: Payer: Self-pay | Admitting: Gastroenterology

## 2016-07-23 ENCOUNTER — Ambulatory Visit (INDEPENDENT_AMBULATORY_CARE_PROVIDER_SITE_OTHER): Payer: BLUE CROSS/BLUE SHIELD | Admitting: Gastroenterology

## 2016-07-23 VITALS — BP 110/70 | HR 64 | Ht 64.75 in | Wt 199.2 lb

## 2016-07-23 DIAGNOSIS — Z1211 Encounter for screening for malignant neoplasm of colon: Secondary | ICD-10-CM

## 2016-07-23 DIAGNOSIS — K5900 Constipation, unspecified: Secondary | ICD-10-CM | POA: Diagnosis not present

## 2016-07-23 DIAGNOSIS — R109 Unspecified abdominal pain: Secondary | ICD-10-CM

## 2016-07-23 MED ORDER — NA SULFATE-K SULFATE-MG SULF 17.5-3.13-1.6 GM/177ML PO SOLN: 1.0000 | Freq: Once | ORAL | 0 refills | Status: AC

## 2016-07-23 NOTE — Progress Notes (Signed)
Agree with assessment and plan as outlined.  

## 2016-07-23 NOTE — Patient Instructions (Signed)
You have been scheduled for a colonoscopy. Please follow written instructions given to you at your visit today.  Please pick up your prep supplies at the pharmacy within the next 1-3 days. If you use inhalers (even only as needed), please bring them with you on the day of your procedure.   Begin using Miralax daily

## 2016-07-23 NOTE — Progress Notes (Signed)
07/23/2016 Madeline Castro [id number] [birth year]   HISTORY OF PRESENT ILLNESS:  This is a 60 year old female who is new to our practice.  Was referred here by Dr. Huntley Decomlin to discuss and schedule colonoscopy.  Never had one in the past.  Is nervous to have one because she had a friend who "bled to death" after a colonoscopy a few years ago.  Reports issues with intubation during her hysterectomy and was told that she needed to relay that information every time that she had a surgery or procedure.    She reports chronic constipation for several years. Says that she only uses MiraLAX with relief on occasion prn. Also reports intermittent cramping abdominal pains that occur a few times per month and usually last about 5 minutes before resolving. Not associated with diarrhea, but gets the sensation she needs to move her bowels but then nothing occurs.  Denies seeing blood in her stools.   Past Medical History:  Diagnosis Date  . Headache   . Hypertension    Past Surgical History:  Procedure Laterality Date  . ABDOMINAL HYSTERECTOMY    . HERNIA REPAIR      reports that she has never smoked. She has never used smokeless tobacco. She reports that she does not drink alcohol or use drugs. family history is not on file. Allergies  Allergen Reactions  . Penicillins Hives and Rash    Has patient had a PCN reaction causing immediate rash, facial/tongue/throat swelling, SOB or lightheadedness with hypotension: yes Has patient had a PCN reaction causing severe rash involving mucus membranes or skin necrosis: no Has patient had a PCN reaction that required hospitalization : already at hospital Has patient had a PCN reaction occurring within the last 10 years: no If all of the above answers are "NO", then may proceed with Cephalosporin use.       Outpatient Encounter Prescriptions as of 07/23/2016  Medication Sig  . SUMAtriptan (IMITREX) 100 MG tablet Take 100 mg by mouth every 2 (two) hours as  needed for migraine. May repeat in 2 hours if headache persists or recurs.  . triamterene-hydrochlorothiazide (MAXZIDE-25) 37.5-25 MG tablet Take 1 tablet by mouth daily.  . [DISCONTINUED] diphenhydrAMINE (SOMINEX) 25 MG tablet Take 25 mg by mouth at bedtime as needed for itching, allergies or sleep.  . [DISCONTINUED] hydrOXYzine (ATARAX/VISTARIL) 25 MG tablet Take 1 tablet (25 mg total) by mouth every 4 (four) hours as needed for itching.  . [DISCONTINUED] sulfamethoxazole-trimethoprim (BACTRIM DS) 800-160 MG tablet Take 1 tablet by mouth 2 (two) times daily.   No facility-administered encounter medications on file as of 07/23/2016.      REVIEW OF SYSTEMS  : All other systems reviewed and negative except where noted in the History of Present Illness.   PHYSICAL EXAM: BP 110/70   Pulse 64   Ht 5' 4.75" (1.645 m)   Wt 199 lb 3.2 oz (90.4 kg)   BMI 33.41 kg/m  General: Well developed black female in no acute distress Head: Normocephalic and atraumatic Eyes:  Sclerae anicteric, conjunctiva pink. Ears: Normal auditory acuity Lungs: Clear throughout to auscultation Heart: Regular rate and rhythm Abdomen: Soft, non-distended.  BS present.  Mild lower abdominal TTP. Rectal:  Will be done at the time of colonoscopy. Musculoskeletal: Symmetrical with no gross deformities  Skin: No lesions on visible extremities Extremities: No edema  Neurological: Alert oriented x 4, grossly non-focal Psychological:  Alert and cooperative. Normal mood and affect  ASSESSMENT AND  PLAN: -Screening colonoscopy:  [facility] due to history of issues with intubation. We'll schedule with Dr. Adela LankArmbruster.  The risks, benefits, and alternatives to colonoscopy were discussed with the patient and she consents to proceed.  -Constipation:  I have asked her to begin taking MiraLAX on a daily basis.  -Intermittent crampy abdominal pain:  ? If related to constipation.   CC:  Harold Hedgeomblin,  James, MD

## 2016-07-31 NOTE — Progress Notes (Signed)
GYNECOLOGIC ONCOLOGY NEW PATIENT CONSULTATION  Date of Service: 08/01/16 Referring Provider: Harold HedgeJames Tomblin, MD Consulting Provider: Junie SpencerLeslie H. Chestine Sporelark, MD  HISTORY OF PRESENT ILLNESS: Madeline Castro is a 60 y.o. woman who is seen in consultation at the request of Dr. 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  In 2006, the patient underwent a hysterectomy for menorrhagia. Since that time she has remarried and reports 6 years of postcoital bleeding. She was seen by Dr. 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 She had a vaginal biopsy showing VAIN1. She did not follow up for further care until this fall. 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 It does not appear that HPV testing has been performed. 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  She has no other significant medical comorbities aside from HTN. She denies active cardiopulmonary complaint. She is currently sexually active with one partner. She has three grown children.  PAST MEDICAL HISTORY: Past Medical History:  Diagnosis Date  . Headache   . Hypertension     PAST SURGICAL HISTORY: Past Surgical History:  Procedure Laterality Date  . ABDOMINAL HYSTERECTOMY    . HERNIA REPAIR      OB/GYN HISTORY: OB History  Gravida Para Term Preterm AB Living  4 3     1 3   SAB TAB Ectopic Multiple Live Births  1            # Outcome Date GA Lbr Len/2nd Weight Sex Delivery Anes PTL Lv  4 SAB           3 Para           2 Para           1 Para             Obstetric Comments  LTCS x1  SAB  SVDx2      MEDICATIONS:  Current Outpatient Prescriptions:  .  SUMAtriptan (IMITREX) 100 MG tablet, Take 100 mg by mouth every 2 (two) hours as needed for migraine. May repeat in 2 hours if headache persists or recurs., Disp: , Rfl:  .  triamterene-hydrochlorothiazide (MAXZIDE-25) 37.5-25 MG  tablet, Take 1 tablet by mouth daily., Disp: , Rfl: 1  ALLERGIES: Allergies  Allergen Reactions  . Penicillins Hives and Rash    Has patient had a PCN reaction causing immediate rash, facial/tongue/throat swelling, SOB or lightheadedness with hypotension: yes Has patient had a PCN reaction causing severe rash involving mucus membranes or skin necrosis: no Has patient had a PCN reaction that required hospitalization : already at hospital Has patient had a PCN reaction occurring within the last 10 years: no If all of the above answers are "NO", then may proceed with Cephalosporin use.     FAMILY HISTORY: Family History  Problem Relation Age of Onset  . Colon cancer Neg Hx   . Esophageal cancer Neg Hx   . Pancreatic cancer Neg Hx   . Stomach cancer Neg Hx   . Liver disease Neg Hx     SOCIAL HISTORY: Social History   Social History  . Marital status: Married    Spouse name: N/A  . Number of children: N/A  . Years of education: N/A   Occupational History  . Not on file.   Social History Main Topics  . Smoking status: Never Smoker  . Smokeless tobacco: Never  Used  . Alcohol use Not on file  . Drug use: Unknown  . Sexual activity: Not on file   Other Topics Concern  . Not on file   Social History Narrative  . No narrative on file    REVIEW OF SYSTEMS: Complete 10-system review is negative except as per HPI.  PHYSICAL EXAM: BP (!) 141/81 (BP Location: Left Arm, Patient Position: Sitting)   Pulse 89   Temp 98.1 F (36.7 C) (Oral)   Resp 18   Wt 199 lb 14.4 oz (90.7 kg)   SpO2 99%   BMI 33.52 kg/m  General: Alert, oriented, no acute distress. HEENT: Normocephalic, atraumatic.  Sclera anicteric, posterior oropharynx clear.  Normal dentition. Chest: Clear to auscultation bilaterally. Cardiovascular: Regular rate and rhythm, no murmurs, rubs, or gallops. Abdomen: Obese. Normoactive bowel sounds.  Soft, nondistended, nontender to palpation.  No masses or  hepatosplenomegaly appreciated.  No evidence of hernia.  No palpable fluid wave.  Well healed pfannenstiel and infraumbilical incisions. Extremities: Grossly normal range of motion.  Warm, well perfused.  No edema bilaterally. Skin: No rashes or lesions. Lymphatics: No cervical, supraclavicular, or inguinal adenopathy. GU: External genitalia without lesions.  On speculum exam, raw appearing vaginal apex, somewhat friable, no discrete lesion, surgically absent uterus.  Bimanual exam reveals intact cuff with surgically absent uterus and cervix, no palpable vaginal mass, no adnexal mass. Rectovaginal exam confirms the above findings and reveals no nodularity.  LABORATORY AND RADIOLOGIC DATA: Outside medical records were reviewed to synthesize the above history, along with the history and physical obtained during the visit.  Outside laboratory, pathology, and imaging reports were reviewed, with pertinent results below.    2006 Hysterectomy path: UTERUS AND CERVIX: - CERVIX: CHRONIC CERVICITIS WITH SQUAMOUS METAPLASIA. NO INTRAEPITHELIAL LESION IDENTIFIED. - ENDOMETRIUM: PROLIFERATIVE ENDOMETRIUM. NO HYPERPLASIA OR MALIGNANCY IDENTIFIED. - MYOMETRIUM: ADENOMYOSIS. LEIOMYOMAS.  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  We discussed the diagnosis of dysplasia and vaginal dysplasia at length today. We discussed the grading of dysplasia and treatment options based on pathology report. We discussed that topical estrogen is used for treatment of VAIN1. Vaginal laser is recommended given [facility]. I have recommended she start using her topical vaginal estrogen therapy and continue this after her postoperative healing. She will need to see anesthesia preoperatively given reported history of difficulty with  anesthesia. She will be scheduled for surgery with Dr. Nelly RoutBrewster just after the new year.  We reviewed the planned surgery in detail.  The risks of surgery were discussed in detail which are limited but can include bleeding, infection, poor wound healing, injury to adjacent organs, anesthesia risk, thromboembolic events, and unforseen complications. She voiced a clear understanding. She had the opportunity to ask questions and she wishes to proceed. The patient reports METs >4 and will not require further cardiac clearance, but will be seen by anesthesia given reported trouble with anesthesia in th past.  Pre-operative and post-operative instructions and expectations were reviewed with the patient in detail. All her questions were answered to her satisfaction.  A copy of this note was sent to the patient's referring provider. I appreciate the opportunity to be involved in the care of this patient.  Junie SpencerLeslie H. Chestine Sporelark, MD

## 2016-08-01 ENCOUNTER — Ambulatory Visit: Payer: BLUE CROSS/BLUE SHIELD | Attending: Gynecologic Oncology | Admitting: Gynecologic Oncology

## 2016-08-01 ENCOUNTER — Encounter: Payer: Self-pay | Admitting: Gynecologic Oncology

## 2016-08-01 VITALS — BP 141/81 | HR 89 | Temp 98.1°F | Resp 18 | Wt 199.9 lb

## 2016-08-01 DIAGNOSIS — Z90710 Acquired absence of both cervix and uterus: Secondary | ICD-10-CM | POA: Diagnosis not present

## 2016-08-01 DIAGNOSIS — N893 Dysplasia of vagina, unspecified: Secondary | ICD-10-CM | POA: Diagnosis not present

## 2016-08-01 DIAGNOSIS — N891 Moderate vaginal dysplasia: Secondary | ICD-10-CM | POA: Diagnosis not present

## 2016-08-01 DIAGNOSIS — N930 Postcoital and contact bleeding: Secondary | ICD-10-CM | POA: Insufficient documentation

## 2016-08-01 DIAGNOSIS — I10 Essential (primary) hypertension: Secondary | ICD-10-CM | POA: Diagnosis not present

## 2016-08-01 DIAGNOSIS — Z9119 Patient's noncompliance with other medical treatment and regimen: Secondary | ICD-10-CM | POA: Diagnosis not present

## 2016-08-01 DIAGNOSIS — Z880 Allergy status to penicillin: Secondary | ICD-10-CM | POA: Insufficient documentation

## 2016-08-01 DIAGNOSIS — N952 Postmenopausal atrophic vaginitis: Secondary | ICD-10-CM

## 2016-08-01 NOTE — Patient Instructions (Signed)
Plan, recommended procedure, Vaginal Laser. Procedure [facility] be done at [facility]. We will call you with available date and time for procedure.     Eat a light diet the day before surgery.  Examples including soups, broths, toast, yogurt, mashed potatoes.  Things to avoid include carbonated beverages (fizzy beverages), raw fruits and raw vegetables, or beans.   If your bowels are filled with gas, your surgeon will have difficulty visualizing your pelvic organs which increases your surgical risks. Blood Transfusion Information WHAT IS A BLOOD TRANSFUSION? A transfusion is the replacement of blood or some of its parts. Blood is made up of multiple cells which provide different functions.  Red blood cells carry oxygen and are used for blood loss replacement.  White blood cells fight against infection.  Platelets control bleeding.  Plasma helps clot blood.  Other blood products are available for specialized needs, such as hemophilia or other clotting disorders. BEFORE THE TRANSFUSION  Who gives blood for transfusions?   You may be able to donate blood to be used at a later date on yourself (autologous donation).  Relatives can be asked to donate blood. This is generally not any safer than if you have received blood from a stranger. The same precautions are taken to ensure safety when a relative's blood is donated.  Healthy volunteers who are fully evaluated to make sure their blood is safe. This is blood bank blood. Transfusion therapy is the safest it has ever been in the practice of medicine. Before blood is taken from a donor, a complete history is taken to make sure that person has no history of diseases nor engages in risky social behavior (examples are intravenous drug use or sexual activity with multiple partners). The donor's travel history is screened to minimize risk of transmitting infections, such as malaria. The donated blood is tested for signs of infectious diseases,  such as HIV and hepatitis. The blood is then tested to be sure it is compatible with you in order to minimize the chance of a transfusion reaction. If you or a relative donates blood, this is often done in anticipation of surgery and is not appropriate for emergency situations. It takes many days to process the donated blood. RISKS AND COMPLICATIONS Although transfusion therapy is very safe and saves many lives, the main dangers of transfusion include:   Getting an infectious disease.  Developing a transfusion reaction. This is an allergic reaction to something in the blood you were given. Every precaution is taken to prevent this. The decision to have a blood transfusion has been considered carefully by your caregiver before blood is given. Blood is not given unless the benefits outweigh the risks.             Preparing for your surgery   Pre-operative Testing -You will receive a phone call from presurgical testing at [facility] to arrange for a pre-operative testing appointment before your surgery.  This appointment normally occurs one to two weeks before your scheduled surgery.   -Bring your insurance card, copy of an advanced directive if applicable, medication list  -At that visit, you will be asked to sign a consent for a possible blood transfusion in case a transfusion becomes necessary during surgery.  The need for a blood transfusion is rare but having consent is a necessary part of your care.     -You should not be taking blood thinners or aspirin at least ten days prior to surgery unless instructed by your  Careers advisersurgeon.  Day Before Surgery at Home -You will be asked to take in a light diet the day before surgery.  Avoid carbonated beverages.  You will be advised to have nothing to eat or drink after midnight the evening before.     Eat a light diet the day before surgery.  Examples including soups, broths,  toast, yogurt, mashed potatoes.  Things to avoid include carbonated  beverages  (fizzy beverages), raw fruits and raw vegetables, or beans.    If your bowels are filled with gas, your surgeon will have difficulty  visualizing your pelvic organs which increases your surgical risks.  Your role in recovery Your role is to become active as soon as directed by your doctor, while still giving yourself time to heal.  Rest when you feel tired. You will be asked to do the following in order to speed your recovery:  - Cough and breathe deeply. This helps toclear and expand your lungs and can prevent pneumonia. You may be given a spirometer to practice deep breathing. A staff member will show you how to use the spirometer. - Do mild physical activity. Walking or moving your legs help your circulation and body functions return to normal. A staff member will help you when you try to walk and will provide you with simple exercises. Do not try to get up or walk alone the first time. - Actively manage your pain. Managing your pain lets you move in comfort. We will ask you to rate your pain on a scale of zero to 10. It is your responsibility to tell your doctor or nurse where and how much you hurt so your pain can be treated.  Special Considerations -If you are diabetic, you may be placed on insulin after surgery to have closer control over your blood sugars to promote healing and recovery.  This does not mean that you will be discharged on insulin.  If applicable, your oral antidiabetics will be resumed when you are tolerating a solid diet.  -Your final pathology results from surgery should be available by the Friday after surgery and the results will be relayed to you when available.

## 2016-08-05 ENCOUNTER — Telehealth: Payer: Self-pay | Admitting: Gastroenterology

## 2016-08-05 NOTE — Progress Notes (Signed)
Pt is being scheduled for preop appt; please place surgical orders in epic. Thanks.  

## 2016-08-05 NOTE — Telephone Encounter (Signed)
Patient called and she is having to have another procedure the same week as scheduled colonoscopy and wishes to reschedule. Cancelled colonoscopy on 08/30/16.  I explained that February is already full and have not seen the schedule for March yet. I told her that I would call her and send new information when I have this completed.

## 2016-08-05 NOTE — Telephone Encounter (Signed)
Thanks for letting me know. She will have to be placed on the next opening for 1/2 days at the hospital. We will have to add her to the March schedule when this is released. Thanks

## 2016-08-21 ENCOUNTER — Encounter (HOSPITAL_COMMUNITY): Payer: BLUE CROSS/BLUE SHIELD

## 2016-08-21 ENCOUNTER — Encounter (HOSPITAL_BASED_OUTPATIENT_CLINIC_OR_DEPARTMENT_OTHER): Payer: Self-pay | Admitting: *Deleted

## 2016-08-21 NOTE — Progress Notes (Addendum)
NPO AFTER MN.  ARRIVE AT 0900.  NEEDS ISTAT 8 AND EKG.  WILL TAKE ZANTAC AM DOS W/ SIPS OF WATER.   REQUESTED ANESTHESIA RECORD FROM 06-25-[facility]IA FA[facility].  PER PT WAS TOLD DIFFICULT INTUBATION, ?SMALL AIRWAY.    ADDENDUM:   MEDICAL RECORDS STATED ANESTHESIA RECORDS NOT AVAILABLE FOR THIS PT'S SURGERY 06-25-2005.  REVIEWED CHART W/ DR Noreene LarssonJOSLIN MDA,  STATES SINCE NO ANESTHESIA DOCUMENTATION RECORD FROM 06-25-2005 SURGERY, HAVE GLIDESCOPE AVAILABLE DOS.

## 2016-08-27 ENCOUNTER — Encounter (HOSPITAL_BASED_OUTPATIENT_CLINIC_OR_DEPARTMENT_OTHER)
Admission: RE | Disposition: A | Discharge status: Home / Self Care | Payer: Self-pay | Source: Ambulatory Visit | Attending: Gynecologic Oncology

## 2016-08-27 ENCOUNTER — Ambulatory Visit (HOSPITAL_BASED_OUTPATIENT_CLINIC_OR_DEPARTMENT_OTHER)
Admission: RE | Admit: 2016-08-27 | Discharge: 2016-08-27 | Disposition: A | Discharge status: Home / Self Care | Payer: BLUE CROSS/BLUE SHIELD | Source: Ambulatory Visit | Attending: Gynecologic Oncology | Admitting: Gynecologic Oncology

## 2016-08-27 ENCOUNTER — Encounter (HOSPITAL_BASED_OUTPATIENT_CLINIC_OR_DEPARTMENT_OTHER): Payer: Self-pay

## 2016-08-27 ENCOUNTER — Ambulatory Visit (HOSPITAL_BASED_OUTPATIENT_CLINIC_OR_DEPARTMENT_OTHER): Payer: BLUE CROSS/BLUE SHIELD | Admitting: Anesthesiology

## 2016-08-27 DIAGNOSIS — D072 Carcinoma in situ of vagina: Secondary | ICD-10-CM | POA: Insufficient documentation

## 2016-08-27 DIAGNOSIS — K219 Gastro-esophageal reflux disease without esophagitis: Secondary | ICD-10-CM | POA: Diagnosis not present

## 2016-08-27 DIAGNOSIS — Z880 Allergy status to penicillin: Secondary | ICD-10-CM | POA: Diagnosis not present

## 2016-08-27 DIAGNOSIS — I10 Essential (primary) hypertension: Secondary | ICD-10-CM | POA: Insufficient documentation

## 2016-08-27 HISTORY — DX: Other constipation: K59.09

## 2016-08-27 HISTORY — DX: Moderate vaginal dysplasia: N89.1

## 2016-08-27 HISTORY — DX: Personal history of other benign neoplasm: Z86.018

## 2016-08-27 HISTORY — DX: Adverse effect of unspecified anesthetic, initial encounter: T41.45XA

## 2016-08-27 HISTORY — DX: Presence of spectacles and contact lenses: Z97.3

## 2016-08-27 HISTORY — PX: LASER ABLATION CONDOLAMATA: SHX5941

## 2016-08-27 HISTORY — DX: Migraine, unspecified, not intractable, without status migrainosus: G43.909

## 2016-08-27 HISTORY — DX: Personal history of vaginal dysplasia: Z87.411

## 2016-08-27 HISTORY — DX: Gastro-esophageal reflux disease without esophagitis: K21.9

## 2016-08-27 HISTORY — DX: Other complications of anesthesia, initial encounter: T88.59XA

## 2016-08-27 LAB — POCT I-STAT, CHEM 8
BUN: 15 mg/dL (ref 6–20)
CALCIUM ION: 1.2 mmol/L (ref 1.15–1.40)
CREATININE: 0.7 mg/dL (ref 0.44–1.00)
Chloride: 104 mmol/L (ref 101–111)
GLUCOSE: 113 mg/dL — AB (ref 65–99)
HCT: 40 % (ref 36.0–46.0)
HEMOGLOBIN: 13.6 g/dL (ref 12.0–15.0)
POTASSIUM: 3.4 mmol/L — AB (ref 3.5–5.1)
Sodium: 140 mmol/L (ref 135–145)
TCO2: 27 mmol/L (ref 0–100)

## 2016-08-27 SURGERY — ABLATION, CONDYLOMA, USING LASER
Anesthesia: General | Site: Vagina

## 2016-08-27 MED ORDER — SCOPOLAMINE 1 MG/3DAYS TD PT72
1.0000 | MEDICATED_PATCH | TRANSDERMAL | Status: DC
  Administered 2016-08-27: 1.5 mg via TRANSDERMAL
  Filled 2016-08-27: qty 1

## 2016-08-27 MED ORDER — PROPOFOL 10 MG/ML IV BOLUS
INTRAVENOUS | Status: AC
  Filled 2016-08-27: qty 40

## 2016-08-27 MED ORDER — ACETIC ACID 5 % SOLN
Status: AC
  Filled 2016-08-27: qty 500

## 2016-08-27 MED ORDER — LACTATED RINGERS IV SOLN
INTRAVENOUS | Status: DC
  Administered 2016-08-27 (×2): via INTRAVENOUS
  Filled 2016-08-27: qty 1000

## 2016-08-27 MED ORDER — LIDOCAINE HCL (CARDIAC) 20 MG/ML IV SOLN
INTRAVENOUS | Status: DC | PRN
  Administered 2016-08-27: 60 mg via INTRAVENOUS

## 2016-08-27 MED ORDER — ONDANSETRON HCL 4 MG/2ML IJ SOLN
INTRAMUSCULAR | Status: DC | PRN
  Administered 2016-08-27: 4 mg via INTRAVENOUS

## 2016-08-27 MED ORDER — VASOPRESSIN 20 UNIT/ML IV SOLN
INTRAVENOUS | Status: AC
  Filled 2016-08-27: qty 1

## 2016-08-27 MED ORDER — FERRIC SUBSULFATE 259 MG/GM EX SOLN
CUTANEOUS | Status: AC
  Filled 2016-08-27: qty 8

## 2016-08-27 MED ORDER — MIDAZOLAM HCL 2 MG/2ML IJ SOLN
INTRAMUSCULAR | Status: AC
  Filled 2016-08-27: qty 2

## 2016-08-27 MED ORDER — IODINE STRONG (LUGOLS) 5 % PO SOLN
ORAL | Status: DC | PRN
  Administered 2016-08-27: 14 mL via ORAL

## 2016-08-27 MED ORDER — DEXAMETHASONE SODIUM PHOSPHATE 4 MG/ML IJ SOLN
INTRAMUSCULAR | Status: DC | PRN
  Administered 2016-08-27: 10 mg via INTRAVENOUS

## 2016-08-27 MED ORDER — SILVER SULFADIAZINE 1 % EX CREA
TOPICAL_CREAM | CUTANEOUS | Status: DC | PRN
  Administered 2016-08-27: 1 via TOPICAL

## 2016-08-27 MED ORDER — HYDROMORPHONE HCL 1 MG/ML IJ SOLN
0.2500 mg | INTRAMUSCULAR | Status: DC | PRN
  Administered 2016-08-27: 0.25 mg via INTRAVENOUS
  Filled 2016-08-27: qty 0.5

## 2016-08-27 MED ORDER — FENTANYL CITRATE (PF) 100 MCG/2ML IJ SOLN
INTRAMUSCULAR | Status: DC | PRN
Start: 2016-08-27 — End: 2016-08-27
  Administered 2016-08-27: 50 ug via INTRAVENOUS
  Administered 2016-08-27 (×2): 25 ug via INTRAVENOUS

## 2016-08-27 MED ORDER — TRAMADOL HCL 50 MG PO TABS
50.0000 mg | ORAL_TABLET | Freq: Once | ORAL | Status: AC
  Administered 2016-08-27: 50 mg via ORAL
  Filled 2016-08-27: qty 1

## 2016-08-27 MED ORDER — LIDOCAINE 2% (20 MG/ML) 5 ML SYRINGE
INTRAMUSCULAR | Status: AC
  Filled 2016-08-27: qty 5

## 2016-08-27 MED ORDER — PROPOFOL 10 MG/ML IV BOLUS
INTRAVENOUS | Status: DC | PRN
  Administered 2016-08-27: 150 mg via INTRAVENOUS

## 2016-08-27 MED ORDER — ONDANSETRON HCL 4 MG/2ML IJ SOLN
INTRAMUSCULAR | Status: AC
  Filled 2016-08-27: qty 2

## 2016-08-27 MED ORDER — TRAMADOL HCL 50 MG PO TABS
ORAL_TABLET | ORAL | Status: AC
  Filled 2016-08-27: qty 1

## 2016-08-27 MED ORDER — HYDROMORPHONE HCL 2 MG/ML IJ SOLN
INTRAMUSCULAR | Status: AC
  Filled 2016-08-27: qty 1

## 2016-08-27 MED ORDER — IODINE STRONG (LUGOLS) 5 % PO SOLN
ORAL | Status: AC
  Filled 2016-08-27: qty 1

## 2016-08-27 MED ORDER — FENTANYL CITRATE (PF) 100 MCG/2ML IJ SOLN
INTRAMUSCULAR | Status: AC
  Filled 2016-08-27: qty 2

## 2016-08-27 MED ORDER — TRAMADOL HCL 50 MG PO TABS: 50.0000 mg | ORAL_TABLET | Freq: Four times a day (QID) | ORAL | 0 refills | Status: AC | PRN

## 2016-08-27 MED ORDER — MIDAZOLAM HCL 5 MG/5ML IJ SOLN
INTRAMUSCULAR | Status: DC | PRN
  Administered 2016-08-27: 2 mg via INTRAVENOUS

## 2016-08-27 MED ORDER — SCOPOLAMINE 1 MG/3DAYS TD PT72
MEDICATED_PATCH | TRANSDERMAL | Status: AC
  Filled 2016-08-27: qty 1

## 2016-08-27 MED ORDER — DEXAMETHASONE SODIUM PHOSPHATE 10 MG/ML IJ SOLN
INTRAMUSCULAR | Status: AC
  Filled 2016-08-27: qty 1

## 2016-08-27 MED ORDER — ESTRADIOL 0.1 MG/GM VA CREA
TOPICAL_CREAM | VAGINAL | Status: AC
  Filled 2016-08-27: qty 42.5

## 2016-08-27 MED ORDER — PROMETHAZINE HCL 25 MG/ML IJ SOLN
6.2500 mg | INTRAMUSCULAR | Status: DC | PRN
  Filled 2016-08-27: qty 1

## 2016-08-27 SURGICAL SUPPLY — 56 items
APPLICATOR COTTON TIP 6IN STRL (MISCELLANEOUS) ×8 IMPLANT
BAG DECANTER FOR FLEXI CONT (MISCELLANEOUS) ×1 IMPLANT
BLADE SURG 11 STRL SS (BLADE) IMPLANT
BLADE SURG 15 STRL LF DISP TIS (BLADE) IMPLANT
BLADE SURG 15 STRL SS (BLADE)
CANISTER SUCTION 1200CC (MISCELLANEOUS) IMPLANT
CANISTER SUCTION 2500CC (MISCELLANEOUS) IMPLANT
CATH ROBINSON RED A/P 16FR (CATHETERS) ×2 IMPLANT
DRAPE LG THREE QUARTER DISP (DRAPES) ×2 IMPLANT
DRAPE UNDERBUTTOCKS STRL (DRAPE) ×2 IMPLANT
DRSG TELFA 3X8 NADH (GAUZE/BANDAGES/DRESSINGS) ×3 IMPLANT
ELECT BALL LEEP 3MM BLK (ELECTRODE) IMPLANT
ELECT BALL LEEP 5MM RED (ELECTRODE) IMPLANT
ELECT BLADE 6.5 .24CM SHAFT (ELECTRODE) ×2 IMPLANT
ELECT NDL TIP 2.8 STRL (NEEDLE) IMPLANT
ELECT NEEDLE TIP 2.8 STRL (NEEDLE) IMPLANT
ELECT REM PT RETURN 9FT ADLT (ELECTROSURGICAL) ×3
ELECTRODE REM PT RTRN 9FT ADLT (ELECTROSURGICAL) IMPLANT
GAUZE SPONGE 4X4 16PLY XRAY LF (GAUZE/BANDAGES/DRESSINGS) ×3 IMPLANT
GLOVE BIOGEL PI IND STRL 7.0 (GLOVE) IMPLANT
GLOVE BIOGEL PI IND STRL 7.5 (GLOVE) IMPLANT
GLOVE BIOGEL PI INDICATOR 7.0 (GLOVE) ×2
GLOVE BIOGEL PI INDICATOR 7.5 (GLOVE) ×2
GLOVE ECLIPSE 7.0 STRL STRAW (GLOVE) ×2 IMPLANT
GLOVE ECLIPSE 7.5 STRL STRAW (GLOVE) ×3 IMPLANT
GLOVE ECLIPSE 8.0 STRL XLNG CF (GLOVE) ×3 IMPLANT
GOWN STRL REUS W/ TWL LRG LVL3 (GOWN DISPOSABLE) ×1 IMPLANT
GOWN STRL REUS W/ TWL XL LVL3 (GOWN DISPOSABLE) ×1 IMPLANT
GOWN STRL REUS W/TWL LRG LVL3 (GOWN DISPOSABLE) ×3
GOWN STRL REUS W/TWL XL LVL3 (GOWN DISPOSABLE) ×2 IMPLANT
KIT ROOM TURNOVER WOR (KITS) ×3 IMPLANT
LEGGING LITHOTOMY PAIR STRL (DRAPES) IMPLANT
NDL SAFETY ECLIPSE 18X1.5 (NEEDLE) ×1 IMPLANT
NDL SPNL 22GX3.5 QUINCKE BK (NEEDLE) ×1 IMPLANT
NEEDLE HYPO 18GX1.5 SHARP (NEEDLE) ×3
NEEDLE SPNL 22GX3.5 QUINCKE BK (NEEDLE) ×3 IMPLANT
NS IRRIG 500ML POUR BTL (IV SOLUTION) IMPLANT
PACK BASIN DAY SURGERY FS (CUSTOM PROCEDURE TRAY) ×3 IMPLANT
PAD DRESSING TELFA 3X8 NADH (GAUZE/BANDAGES/DRESSINGS) ×1 IMPLANT
PAD OB MATERNITY 4.3X12.25 (PERSONAL CARE ITEMS) ×3 IMPLANT
PAD PREP 24X48 CUFFED NSTRL (MISCELLANEOUS) ×3 IMPLANT
PENCIL BUTTON HOLSTER BLD 10FT (ELECTRODE) ×2 IMPLANT
SCOPETTES 8  STERILE (MISCELLANEOUS) ×6
SCOPETTES 8 STERILE (MISCELLANEOUS) IMPLANT
SUT VIC AB 2-0 SH 27 (SUTURE) ×3
SUT VIC AB 2-0 SH 27XBRD (SUTURE) ×2 IMPLANT
SYR CONTROL 10ML LL (SYRINGE) ×3 IMPLANT
SYRINGE LUER LOK 1CC (MISCELLANEOUS) ×3 IMPLANT
TOWEL OR 17X24 6PK STRL BLUE (TOWEL DISPOSABLE) ×6 IMPLANT
TRAY DSU PREP LF (CUSTOM PROCEDURE TRAY) IMPLANT
TUBE CONNECTING 12'X1/4 (SUCTIONS) ×1
TUBE CONNECTING 12X1/4 (SUCTIONS) ×2 IMPLANT
VACUUM HOSE 7/8X10 W/ WAND (MISCELLANEOUS) IMPLANT
VACUUM HOSE/TUBING 7/8INX6FT (MISCELLANEOUS) ×2 IMPLANT
WATER STERILE IRR 500ML POUR (IV SOLUTION) ×3 IMPLANT
YANKAUER SUCT BULB TIP NO VENT (SUCTIONS) IMPLANT

## 2016-08-27 NOTE — Anesthesia Preprocedure Evaluation (Signed)
Anesthesia Evaluation  Patient identified by MRN, date of birth, ID band Patient awake    Reviewed: Allergy & Precautions, NPO status , Patient's Chart, lab work & pertinent test results  History of Anesthesia Complications (+) history of anesthetic complications  Airway Mallampati: II  TM Distance: >3 FB Neck ROM: Full    Dental no notable dental hx. (+) Dental Advisory Given   Pulmonary neg pulmonary ROS,    Pulmonary exam normal        Cardiovascular hypertension, Pt. on medications Normal cardiovascular exam     Neuro/Psych  Headaches, negative psych ROS   GI/Hepatic Neg liver ROS, GERD  Medicated and Controlled,  Endo/Other  negative endocrine ROS  Renal/GU negative Renal ROS     Musculoskeletal   Abdominal   Peds  Hematology   Anesthesia Other Findings   Reproductive/Obstetrics                             Anesthesia Physical Anesthesia Plan  ASA: II  Anesthesia Plan: General   Post-op Pain Management:    Induction:   Airway Management Planned: LMA  Additional Equipment:   Intra-op Plan:   Post-operative Plan: Extubation in OR  Informed Consent: I have reviewed the patients History and Physical, chart, labs and discussed the procedure including the risks, benefits and alternatives for the proposed anesthesia with the patient or authorized representative who has indicated his/her understanding and acceptance.   Dental advisory given  Plan Discussed with: CRNA  Anesthesia Plan Comments: (Pt reports difficulty with anesthesia in the past but is not able to give specifics.  She voiced concerns that there is no documentation of her difficulty.  She was given the option of a spinal block instead of a GA, but she refused.  Her last surgery was in 2005 and there is no anesthesia record on EPIC.  Will proceed with GA, LMA.)        Anesthesia Quick Evaluation

## 2016-08-27 NOTE — Discharge Instructions (Signed)

## 2016-08-27 NOTE — Transfer of Care (Addendum)
Last Vitals:  Vitals:   08/27/16 0907 08/27/16 1257  BP: 139/83 (!) 148/97  Pulse: 81   Resp: 16 13  Temp: 36.8 C 36.2 C    Last Pain:  Vitals:   08/27/16 1257  TempSrc:   PainSc: Asleep      Patients Stated Pain Goal: 8 (08/27/16 0929) Immediate Anesthesia Transfer of Care Note  Patient: Madeline Castro  Procedure(s) Performed: Procedure(s) (LRB): VAGINAL LASER ABLATION, WITH VAGINAL BIOPSY (N/A)  Patient Location: PACU  Anesthesia Type: General  Level of Consciousness: awake, alert  and oriented  Airway & Oxygen Therapy: Patient Spontanous Breathing and Patient connected to nasal cannula oxygen  Post-op Assessment: Report given to PACU RN and Post -op Vital signs reviewed and stable  Post vital signs: Reviewed and stable  Complications: No apparent anesthesia complications

## 2016-08-27 NOTE — Interval H&P Note (Signed)
History and Physical Interval Note:  08/27/2016 11:45 AM  Madeline Castro  has presented today for surgery, with the diagnosis of VAIN 2  The various methods of treatment have been discussed with the patient and family. After consideration of risks, benefits and other options for treatment, the patient has consented to  Procedure(s): VAGINAL LASER ABLATION (N/A) as a surgical intervention .  The patient's history has been reviewed, patient examined, no change in status, stable for surgery.  I have reviewed the patient's chart and labs.  Questions were answered to the patient's satisfaction.     [place] Creek[place]WSTER, [facility]ENDY

## 2016-08-27 NOTE — Anesthesia Postprocedure Evaluation (Signed)
Anesthesia Post Note  Patient: Denice BorsBarbara L Routt  Procedure(s) Performed: Procedure(s) (LRB): VAGINAL LASER ABLATION, WITH VAGINAL BIOPSY (N/A)  Patient location during evaluation: PACU Anesthesia Type: General Level of consciousness: awake and alert Pain management: pain level controlled Vital Signs Assessment: post-procedure vital signs reviewed and stable Respiratory status: spontaneous breathing, nonlabored ventilation, respiratory function stable and patient connected to nasal cannula oxygen Cardiovascular status: blood pressure returned to baseline and stable Postop Assessment: no signs of nausea or vomiting Anesthetic complications: no       Last Vitals:  Vitals:   08/27/16 1315 08/27/16 1330  BP: 135/87 (!) 142/87  Pulse: 77 73  Resp: 15 17  Temp:      Last Pain:  Vitals:   08/27/16 1330  TempSrc:   PainSc: 8                  Kennieth RadFitzgerald, Zakariye Nee E

## 2016-08-27 NOTE — Op Note (Signed)
OPERATIVE NOTE  Preoperative Diagnosis: VAIN III  Postoperative Diagnosis: VAIN III  Procedure(s) Performed:Vaginal bx, Extensive CO2 laser of the vaginal apex  5cm x 4cm  Surgeon: Maryclare LabradorWendy R.  Nelly RoutBrewster, M.D. PhD  Anesthesia: LMA  Indication for Procedure:VAIN III  Operative Findings: papillary changes of the upper vagina.  Leading edge was biopsied in multiple areas.  Lugol's applied without uptake at the apex extending to 2cm from the introitus  Procedure:Ginia L Offord was taken to the OR and timeout performed.  She was then placed under general anesthesia and her feet placed in the dorsal lithotomy position.    Caci L Harnois was prepped and draped in the usual sterile fashion.   A second time out was performed.  The lesion at the apex was appreciated and multiple biopsies were collected.  The area was lasered with a diameter size of 1 mm  8W to the third surgical plane encompassing a 5mm margin.  Hemostasis was managed with bovie cautery.     Specimens: vaginal biopsies.    Estimated Blood Loss: 10 mL. Blood Replacement: non indicated  Urine Output:   Sponge, lap and needle counts were correct x 3.    Complications:none  The patient had sequential compression devices for VTE prophylaxis          Disposition: PACU - hemodynamically stable.         Condition: stable

## 2016-08-27 NOTE — H&P (View-Only) (Signed)
GYNECOLOGIC ONCOLOGY NEW PATIENT CONSULTATION  Date of Service: 08/01/16 Referring Provider: Harold HedgeJames Tomblin, MD Consulting Provider: Junie SpencerLeslie H. Chestine Sporelark, MD  HISTORY OF PRESENT ILLNESS: Madeline GavelBarbara L Tillis is a 61 y.o. woman who is seen in consultation at the request of Dr. Henderson Cloudomblin for evaluation of Shirlyn GoltzVAIN2.  In 2006, the patient underwent a hysterectomy for menorrhagia. Since that time she has remarried and reports 6 years of postcoital bleeding. She was seen by Dr. 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 She had a vaginal biopsy showing VAIN1. She did not follow up for further care until this fall. 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 It does not appear that HPV testing has been performed. 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  She has no other significant medical comorbities aside from HTN. She denies active cardiopulmonary complaint. She is currently sexually active with one partner. She has three grown children.  PAST MEDICAL HISTORY: Past Medical History:  Diagnosis Date  . Headache   . Hypertension     PAST SURGICAL HISTORY: Past Surgical History:  Procedure Laterality Date  . ABDOMINAL HYSTERECTOMY    . HERNIA REPAIR      OB/GYN HISTORY: OB History  Gravida Para Term Preterm AB Living  4 3     1 3   SAB TAB Ectopic Multiple Live Births  1            # Outcome Date GA Lbr Len/2nd Weight Sex Delivery Anes PTL Lv  4 SAB           3 Para           2 Para           1 Para             Obstetric Comments  LTCS x1  SAB  SVDx2      MEDICATIONS:  Current Outpatient Prescriptions:  .  SUMAtriptan (IMITREX) 100 MG tablet, Take 100 mg by mouth every 2 (two) hours as needed for migraine. May repeat in 2 hours if headache persists or recurs., Disp: , Rfl:  .  triamterene-hydrochlorothiazide (MAXZIDE-25) 37.5-25 MG  tablet, Take 1 tablet by mouth daily., Disp: , Rfl: 1  ALLERGIES: Allergies  Allergen Reactions  . Penicillins Hives and Rash    Has patient had a PCN reaction causing immediate rash, facial/tongue/throat swelling, SOB or lightheadedness with hypotension: yes Has patient had a PCN reaction causing severe rash involving mucus membranes or skin necrosis: no Has patient had a PCN reaction that required hospitalization : already at hospital Has patient had a PCN reaction occurring within the last 10 years: no If all of the above answers are "NO", then may proceed with Cephalosporin use.     FAMILY HISTORY: Family History  Problem Relation Age of Onset  . Colon cancer Neg Hx   . Esophageal cancer Neg Hx   . Pancreatic cancer Neg Hx   . Stomach cancer Neg Hx   . Liver disease Neg Hx     SOCIAL HISTORY: Social History   Social History  . Marital status: Married    Spouse name: N/A  . Number of children: N/A  . Years of education: N/A   Occupational History  . Not on file.   Social History Main Topics  . Smoking status: Never Smoker  . Smokeless tobacco: Never  Used  . Alcohol use Not on file  . Drug use: Unknown  . Sexual activity: Not on file   Other Topics Concern  . Not on file   Social History Narrative  . No narrative on file    REVIEW OF SYSTEMS: Complete 10-system review is negative except as per HPI.  PHYSICAL EXAM: BP (!) 141/81 (BP Location: Left Arm, Patient Position: Sitting)   Pulse 89   Temp 98.1 F (36.7 C) (Oral)   Resp 18   Wt 199 lb 14.4 oz (90.7 kg)   SpO2 99%   BMI 33.52 kg/m  General: Alert, oriented, no acute distress. HEENT: Normocephalic, atraumatic.  Sclera anicteric, posterior oropharynx clear.  Normal dentition. Chest: Clear to auscultation bilaterally. Cardiovascular: Regular rate and rhythm, no murmurs, rubs, or gallops. Abdomen: Obese. Normoactive bowel sounds.  Soft, nondistended, nontender to palpation.  No masses or  hepatosplenomegaly appreciated.  No evidence of hernia.  No palpable fluid wave.  Well healed pfannenstiel and infraumbilical incisions. Extremities: Grossly normal range of motion.  Warm, well perfused.  No edema bilaterally. Skin: No rashes or lesions. Lymphatics: No cervical, supraclavicular, or inguinal adenopathy. GU: External genitalia without lesions.  On speculum exam, raw appearing vaginal apex, somewhat friable, no discrete lesion, surgically absent uterus.  Bimanual exam reveals intact cuff with surgically absent uterus and cervix, no palpable vaginal mass, no adnexal mass. Rectovaginal exam confirms the above findings and reveals no nodularity.  LABORATORY AND RADIOLOGIC DATA: Outside medical records were reviewed to synthesize the above history, along with the history and physical obtained during the visit.  Outside laboratory, pathology, and imaging reports were reviewed, with pertinent results below.    2006 Hysterectomy path: UTERUS AND CERVIX: - CERVIX: CHRONIC CERVICITIS WITH SQUAMOUS METAPLASIA. NO INTRAEPITHELIAL LESION IDENTIFIED. - ENDOMETRIUM: PROLIFERATIVE ENDOMETRIUM. NO HYPERPLASIA OR MALIGNANCY IDENTIFIED. - MYOMETRIUM: ADENOMYOSIS. LEIOMYOMAS.  2010 vaginal biopsy: VAIN1  2017: vaginal biopsy: VAIN1-2   ASSESSMENT AND PLAN: Madeline GavelBarbara L Castro is a 61 y.o. 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  We discussed the diagnosis of dysplasia and vaginal dysplasia at length today. We discussed the grading of dysplasia and treatment options based on pathology report. We discussed that topical estrogen is used for treatment of VAIN1. Vaginal laser is recommended given progression to [facility] Health InstituteVAIN2. I have recommended she start using her topical vaginal estrogen therapy and continue this after her postoperative healing. She will need to see anesthesia preoperatively given reported history of difficulty with  anesthesia. She will be scheduled for surgery with Dr. Nelly RoutBrewster just after the new year.  We reviewed the planned surgery in detail.  The risks of surgery were discussed in detail which are limited but can include bleeding, infection, poor wound healing, injury to adjacent organs, anesthesia risk, thromboembolic events, and unforseen complications. She voiced a clear understanding. She had the opportunity to ask questions and she wishes to proceed. The patient reports METs >4 and will not require further cardiac clearance, but will be seen by anesthesia given reported trouble with anesthesia in th past.  Pre-operative and post-operative instructions and expectations were reviewed with the patient in detail. All her questions were answered to her satisfaction.  A copy of this note was sent to the patient's referring provider. I appreciate the opportunity to be involved in the care of this patient.  Junie SpencerLeslie H. Chestine Sporelark, MD

## 2016-08-27 NOTE — Anesthesia Procedure Notes (Signed)
Procedure Name: LMA Insertion Date/Time: 08/27/2016 12:04 PM Performed by: Marcene DuosFITZGERALD, ROBERT Pre-anesthesia Checklist: Patient identified, Emergency Drugs available, Suction available and Patient being monitored Patient Re-evaluated:Patient Re-evaluated prior to inductionOxygen Delivery Method: Circle system utilized Preoxygenation: Pre-oxygenation with 100% oxygen Intubation Type: IV induction Ventilation: Mask ventilation without difficulty LMA: LMA inserted LMA Size: 4.0 Number of attempts: 1 Airway Equipment and Method: Bite block Placement Confirmation: positive ETCO2 Tube secured with: Tape Dental Injury: Teeth and Oropharynx as per pre-operative assessment

## 2016-08-28 ENCOUNTER — Encounter (HOSPITAL_BASED_OUTPATIENT_CLINIC_OR_DEPARTMENT_OTHER): Payer: Self-pay | Admitting: Gynecologic Oncology

## 2016-08-30 ENCOUNTER — Encounter (HOSPITAL_COMMUNITY): Payer: Self-pay

## 2016-08-30 ENCOUNTER — Ambulatory Visit (HOSPITAL_COMMUNITY): Admit: 2016-08-30 | Payer: BLUE CROSS/BLUE SHIELD | Admitting: Gastroenterology

## 2016-08-30 SURGERY — COLONOSCOPY
Anesthesia: Monitor Anesthesia Care

## 2016-09-05 ENCOUNTER — Other Ambulatory Visit: Payer: Self-pay

## 2016-09-05 ENCOUNTER — Telehealth: Payer: Self-pay

## 2016-09-05 DIAGNOSIS — Z1211 Encounter for screening for malignant neoplasm of colon: Secondary | ICD-10-CM

## 2016-09-05 NOTE — Telephone Encounter (Signed)
Patient is now [facility]ched[facility]. New prep instructions mailed.

## 2016-09-05 NOTE — Progress Notes (Signed)
Prep instructions mailed

## 2016-09-22 NOTE — Progress Notes (Signed)
GYNECOLOGIC ONCOLOGY OFFICE VISIT   Referring Provider: Harold Hedge, MD CHIEF COMPLAINT:  Post op check  HISTORY OF PRESENT ILLNESS: Madeline Castro is a 61 y.o. woman who is seen in consultation at the request of Dr. Henderson Cloud for evaluation of Madeline Castro.  In 2006, the patient underwent a hysterectomy for menorrhagia. Since that time she has remarried and reports 6 years of postcoital bleeding. She was seen by Dr. Henderson Cloud in 2010 and underwent a pap smear which was LSIL. She had a vaginal biopsy showing VAIN1. She did not follow up for further care until this fall. 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 It does not appear that HPV testing has been performed. 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  2010 vaginal biopsy: VAIN1  2017: vaginal biopsy: VAIN1-2  Underwent CO2 laser 08/27/2016  of thick vaginal changes.  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    PAST MEDICAL HISTORY: Past Medical History:  Diagnosis Date  . Chronic constipation   . Complication of anesthesia    PER PT WAS TOLD DIFFICULT AIRWAY BY DR [facility] WHOM DID LAVH 06-25-2005.  PER PT WAS NEVER GIVEN LETTER FROM ANESTHESIA STATING THIS AND PT HAS NOT HAD ANY SURGERY'S SINCE.  REQUESTED MEDICAL RECORDS TO [facility] RECORDS FROM 06-25-2005.  Marland Kitchen GERD (gastroesophageal reflux disease)   . History of uterine leiomyoma   . History of vaginal dysplasia    2010  VAIN 1  . Hypertension   . Migraine   . VAIN II (vaginal intraepithelial neoplasia grade II)   . Wears glasses     PAST SURGICAL HISTORY: Past Surgical History:  Procedure Laterality Date  . CESAREAN SECTION  1984  . COMBINED HYSTEROSCOPY DIAGNOSTIC / D&C  2005  . HYMENECTOMY  age 24   . HYSTEROSCOPY WITH RESECTOSCOPE  1999   MYOMECTOMY  . LAPAROSCOPIC ASSISTED VAGINAL HYSTERECTOMY  06/25/2005  . LASER ABLATION CONDOLAMATA N/A 08/27/2016   Procedure: VAGINAL LASER ABLATION, WITH VAGINAL BIOPSY;  Surgeon: Laurette Schimke, MD;  Location: [facility] [place];  Service: Gynecology;  Laterality: N/A;  . TRANSTHORACIC ECHOCARDIOGRAM  05/25/2004   ef 50-55%/  mild TR  . UMBILICAL HERNIA REPAIR  1996    OB/GYN HISTORY: OB History  Gravida Para Term Preterm AB Living  4 3     1 3   SAB TAB Ectopic Multiple Live Births  1            # Outcome Date GA Lbr Len/2nd Weight Sex Delivery Anes PTL Lv  4 SAB           3 Para           2 Para           1 Para             Obstetric Comments  LTCS x1  SAB  SVDx2       FAMILY HISTORY: Family History  Problem Relation Age of Onset  . Colon cancer Neg Hx   . Esophageal cancer Neg Hx   . Pancreatic cancer Neg Hx   . Stomach cancer Neg Hx   . Liver disease Neg Hx   . Breast cancer Neg Hx   . Ovarian cancer Neg Hx  SOCIAL HISTORY: Social History   Social History  . Marital status: Married    Spouse name: N/A  . Number of children: N/A  . Years of education: N/A   Occupational History  . Not on file.   Social History Main Topics  . Smoking status: Never Smoker  . Smokeless tobacco: Never Used  . Alcohol use No  . Drug use: No  . Sexual activity: Not on file   Other Topics Concern  . Not on file   Social History Narrative  . No narrative on file    REVIEW OF SYSTEMS: Vaginal discomfort with trace discharge  PHYSICAL EXAM: BP 129/80 (BP Location: Left Arm, Patient Position: Sitting)   Pulse 72   Temp 97.6 F (36.4 C) (Oral)   Resp 17   Ht 5\' 5"  (1.651 m)   Wt 200 lb 3.2 oz (90.8 kg)   SpO2 100%   BMI 33.32 kg/m  Extremities: Grossly normal range of motion.  Warm, well perfused.  No edema bilaterally. Skin: No rashes or lesions. Lymphatics: No cervical, supraclavicular, or inguinal  adenopathy. GU: External genitalia without lesions.  On speculum exam, healing vaginal apex.  No bleeding or discharge   ASSESSMENT AND PLAN: Madeline Castro is a 61 y.o. 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 S/P CO2 laser  09/04/2016 Apply 1gm vaginal estrogen weekly F/U to repeat pap in 6 months.  Discussed concern for recurrence/persistance given the extent of the disease. Vaginal rest  for 6 weeks.

## 2016-09-23 ENCOUNTER — Ambulatory Visit: Payer: BLUE CROSS/BLUE SHIELD | Attending: Gynecologic Oncology | Admitting: Gynecologic Oncology

## 2016-09-23 ENCOUNTER — Encounter: Payer: Self-pay | Admitting: Gynecologic Oncology

## 2016-09-23 VITALS — BP 129/80 | HR 72 | Temp 97.6°F | Resp 17 | Ht 65.0 in | Wt 200.2 lb

## 2016-09-23 DIAGNOSIS — Z9119 Patient's noncompliance with other medical treatment and regimen: Secondary | ICD-10-CM | POA: Insufficient documentation

## 2016-09-23 DIAGNOSIS — K219 Gastro-esophageal reflux disease without esophagitis: Secondary | ICD-10-CM | POA: Diagnosis not present

## 2016-09-23 DIAGNOSIS — Z90710 Acquired absence of both cervix and uterus: Secondary | ICD-10-CM | POA: Insufficient documentation

## 2016-09-23 DIAGNOSIS — N952 Postmenopausal atrophic vaginitis: Secondary | ICD-10-CM | POA: Diagnosis not present

## 2016-09-23 DIAGNOSIS — I10 Essential (primary) hypertension: Secondary | ICD-10-CM | POA: Diagnosis not present

## 2016-09-23 DIAGNOSIS — Z9889 Other specified postprocedural states: Secondary | ICD-10-CM | POA: Insufficient documentation

## 2016-09-23 DIAGNOSIS — N891 Moderate vaginal dysplasia: Secondary | ICD-10-CM

## 2016-09-23 DIAGNOSIS — N930 Postcoital and contact bleeding: Secondary | ICD-10-CM | POA: Insufficient documentation

## 2016-09-23 DIAGNOSIS — G43909 Migraine, unspecified, not intractable, without status migrainosus: Secondary | ICD-10-CM | POA: Insufficient documentation

## 2016-09-23 NOTE — Patient Instructions (Signed)
No intercourse, nothing in the vagina for 6 weeks. Follow up with GYN Oncology in July. Please call our office in April 2018 to schedule your July appt with Dr. Nelly RoutBrewster.

## 2016-11-08 ENCOUNTER — Telehealth: Payer: Self-pay | Admitting: Gastroenterology

## 2016-11-08 NOTE — Telephone Encounter (Signed)
Tried to call endo to cancel procedure, no answer, will try Monday. Do you know of group or MD in [place] to refer patient to? Thanks.

## 2016-11-11 NOTE — Telephone Encounter (Signed)
There are a few large GI groups in [place] but I think any of them can handle this procedure for her. She may need to contact her primary care to put in a referral. Thanks for letting me know

## 2016-11-11 NOTE — Telephone Encounter (Signed)
Left message for patient with information and suggested that she contact her PCP to get referral to one of the groups.

## 2016-11-12 ENCOUNTER — Ambulatory Visit (HOSPITAL_COMMUNITY): Admission: RE | Admit: 2016-11-12 | Payer: 59 | Source: Ambulatory Visit | Admitting: Gastroenterology

## 2016-11-12 SURGERY — COLONOSCOPY WITH PROPOFOL
Anesthesia: Monitor Anesthesia Care

## 2017-10-21 ENCOUNTER — Telehealth: Payer: Self-pay | Admitting: *Deleted

## 2017-10-21 NOTE — Telephone Encounter (Signed)
Called and left the patient a message to call the office back. Patient needs to be notified of her follow up appt with Dr. Nelly RoutBrewster on March 26th at 10:30am.   Called and spoke with Talbert ForestShirley at Dr. Tiburcio PeaHarris office [facility] with the appt information

## 2017-10-22 ENCOUNTER — Telehealth: Payer: Self-pay | Admitting: *Deleted

## 2017-10-22 NOTE — Telephone Encounter (Signed)
Attempted to contact the patient to give the follow up appt. No answer, unable to leave voicemail, mail box was full.

## 2017-10-27 ENCOUNTER — Telehealth: Payer: Self-pay | Admitting: *Deleted

## 2017-10-27 NOTE — Telephone Encounter (Signed)
Called and left a message for the patient to call the office back. Patient needs to be giving the appt for MArch 26th

## 2017-10-28 ENCOUNTER — Telehealth: Payer: Self-pay | Admitting: *Deleted

## 2017-10-28 NOTE — Telephone Encounter (Signed)
Attempted to call the patient regarding her appt, but the voicemail was full.

## 2017-10-30 ENCOUNTER — Telehealth: Payer: Self-pay | Admitting: *Deleted

## 2017-10-30 NOTE — Telephone Encounter (Signed)
Called and left the patient a message to call the office back. Left message in the machine with the appt date/time of March 26th at 10:30am

## 2017-11-06 ENCOUNTER — Telehealth: Payer: Self-pay | Admitting: *Deleted

## 2017-11-06 NOTE — Telephone Encounter (Signed)
Called and left message for the patient to call the office back regardng an appt

## 2017-11-07 ENCOUNTER — Telehealth: Payer: Self-pay | Admitting: *Deleted

## 2017-11-07 NOTE — Telephone Encounter (Signed)
Called, left both the patient and her spouse a message to call the office back on Monday.

## 2017-11-10 ENCOUNTER — Telehealth: Payer: Self-pay | Admitting: *Deleted

## 2017-11-10 NOTE — Telephone Encounter (Signed)
Attempted to contact the patient, voice mail is full. Unable to leave message

## 2017-11-14 ENCOUNTER — Telehealth: Payer: Self-pay | Admitting: *Deleted

## 2017-11-14 NOTE — Telephone Encounter (Signed)
Attempted to reach patient, no answer and mailbox is full.  

## 2017-11-17 ENCOUNTER — Telehealth: Payer: Self-pay | Admitting: *Deleted

## 2017-11-17 NOTE — Telephone Encounter (Signed)
Called both the patient's and her spouse phones. Left a message to call the office and left message with the appt time for tomorrow.

## 2017-11-18 ENCOUNTER — Telehealth: Payer: Self-pay | Admitting: *Deleted

## 2017-11-18 ENCOUNTER — Ambulatory Visit: Payer: 59 | Admitting: Gynecologic Oncology

## 2017-11-18 NOTE — Telephone Encounter (Signed)
Moved appt and mailed calendar

## 2017-12-04 ENCOUNTER — Ambulatory Visit: Payer: 59 | Admitting: Gynecologic Oncology

## 2017-12-09 ENCOUNTER — Telehealth: Payer: Self-pay | Admitting: *Deleted

## 2017-12-09 NOTE — Telephone Encounter (Signed)
Called and left a message at Dr. Johnathan HausenHarris's office that the patient hasn't shown or return calls for appts

## 2018-06-23 ENCOUNTER — Telehealth: Payer: Self-pay | Admitting: *Deleted

## 2018-06-23 NOTE — Telephone Encounter (Signed)
Patient called stating "I moved to [place] and want to see a doctor here in town. I wanted to see who they recommend." Per Melissa APP the patient can follow up with a regular GYN. Gave the patient the name and number for [facility] [phone].

## 2018-06-24 ENCOUNTER — Telehealth: Payer: Self-pay | Admitting: *Deleted

## 2018-06-24 NOTE — Telephone Encounter (Signed)
Returned the patient's call regarding a Gyn Onc in [place]. Gave the patient the name and number to [facility] in [place] [phone]

## 2018-06-24 NOTE — Telephone Encounter (Signed)
Called and spoke with Janine Limbo at [facility] in [place], referral given over the phone. They will contact the patient with an appt. Records faxed to [phone]. Called and left the patient a message over the phone.

## 2018-07-28 ENCOUNTER — Telehealth: Payer: Self-pay | Admitting: *Deleted

## 2018-07-28 NOTE — Telephone Encounter (Signed)
Per note from Dr[facility] Neil [facility]ane at [facility] I have attempted to contact the patient to schedule an appt. Unable to leave a message, voicemail was full.

## 2018-08-04 ENCOUNTER — Telehealth: Payer: Self-pay | Admitting: *Deleted

## 2018-08-04 NOTE — Telephone Encounter (Signed)
Called and left the patient a message to call the office back. Need to see if the patient is going to follow up with Dr. Nelly RoutBrewster or see Dr.Crane in [place].

## 2018-08-04 NOTE — Telephone Encounter (Signed)
Patient called back and is going to contu=inue her care with Dr. Neil [place]rane in [place]
